# Patient Record
Sex: Female | Born: 1980 | Race: Black or African American | Hispanic: No | Marital: Single | Smoking: Never smoker
Health system: Southern US, Community
[De-identification: ages and names within clinical notes are randomized; demographics above are authoritative.]

## PROBLEM LIST (undated history)

## (undated) DIAGNOSIS — F410 Panic disorder [episodic paroxysmal anxiety] without agoraphobia: Secondary | ICD-10-CM

## (undated) DIAGNOSIS — R51 Headache: Secondary | ICD-10-CM

## (undated) DIAGNOSIS — F419 Anxiety disorder, unspecified: Secondary | ICD-10-CM

## (undated) DIAGNOSIS — R519 Headache, unspecified: Secondary | ICD-10-CM

## (undated) DIAGNOSIS — J45909 Unspecified asthma, uncomplicated: Secondary | ICD-10-CM

## (undated) DIAGNOSIS — F39 Unspecified mood [affective] disorder: Secondary | ICD-10-CM

## (undated) DIAGNOSIS — F909 Attention-deficit hyperactivity disorder, unspecified type: Secondary | ICD-10-CM

## (undated) DIAGNOSIS — F32A Depression, unspecified: Secondary | ICD-10-CM

## (undated) DIAGNOSIS — F319 Bipolar disorder, unspecified: Secondary | ICD-10-CM

## (undated) DIAGNOSIS — T7840XA Allergy, unspecified, initial encounter: Secondary | ICD-10-CM

## (undated) DIAGNOSIS — F329 Major depressive disorder, single episode, unspecified: Secondary | ICD-10-CM

## (undated) HISTORY — DX: Major depressive disorder, single episode, unspecified: F32.9

## (undated) HISTORY — DX: Unspecified asthma, uncomplicated: J45.909

## (undated) HISTORY — DX: Unspecified mood (affective) disorder: F39

## (undated) HISTORY — DX: Anxiety disorder, unspecified: F41.9

## (undated) HISTORY — DX: Allergy, unspecified, initial encounter: T78.40XA

## (undated) HISTORY — DX: Headache, unspecified: R51.9

## (undated) HISTORY — DX: Panic disorder (episodic paroxysmal anxiety): F41.0

## (undated) HISTORY — DX: Depression, unspecified: F32.A

## (undated) HISTORY — DX: Headache: R51

## (undated) HISTORY — DX: Bipolar disorder, unspecified: F31.9

## (undated) HISTORY — DX: Attention-deficit hyperactivity disorder, unspecified type: F90.9

---
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
## 1998-03-21 ENCOUNTER — Inpatient Hospital Stay (HOSPITAL_COMMUNITY): Admission: AD | Admit: 1998-03-21 | Discharge: 1998-03-21 | Payer: Self-pay | Admitting: Obstetrics

## 1998-03-31 ENCOUNTER — Ambulatory Visit (HOSPITAL_COMMUNITY): Admission: RE | Admit: 1998-03-31 | Discharge: 1998-03-31 | Payer: Self-pay | Admitting: Obstetrics

## 1998-06-24 ENCOUNTER — Inpatient Hospital Stay (HOSPITAL_COMMUNITY): Admission: AD | Admit: 1998-06-24 | Discharge: 1998-06-26 | Payer: Self-pay | Admitting: Obstetrics

## 1998-06-30 ENCOUNTER — Emergency Department (HOSPITAL_COMMUNITY): Admission: EM | Admit: 1998-06-30 | Discharge: 1998-06-30 | Payer: Self-pay | Admitting: Emergency Medicine

## 1999-05-17 ENCOUNTER — Emergency Department (HOSPITAL_COMMUNITY): Admission: EM | Admit: 1999-05-17 | Discharge: 1999-05-17 | Payer: Self-pay | Admitting: Emergency Medicine

## 1999-11-08 ENCOUNTER — Other Ambulatory Visit: Admission: RE | Admit: 1999-11-08 | Discharge: 1999-11-08 | Payer: Self-pay | Admitting: Obstetrics

## 2000-08-27 ENCOUNTER — Other Ambulatory Visit: Admission: RE | Admit: 2000-08-27 | Discharge: 2000-08-27 | Payer: Self-pay | Admitting: Obstetrics

## 2001-02-24 ENCOUNTER — Emergency Department (HOSPITAL_COMMUNITY): Admission: EM | Admit: 2001-02-24 | Discharge: 2001-02-24 | Payer: Self-pay | Admitting: Emergency Medicine

## 2001-03-05 ENCOUNTER — Ambulatory Visit (HOSPITAL_COMMUNITY): Admission: RE | Admit: 2001-03-05 | Discharge: 2001-03-05 | Payer: Self-pay | Admitting: Family Medicine

## 2001-03-05 ENCOUNTER — Encounter: Payer: Self-pay | Admitting: Family Medicine

## 2001-12-25 ENCOUNTER — Encounter: Admission: RE | Admit: 2001-12-25 | Discharge: 2001-12-25 | Payer: Self-pay | Admitting: Family Medicine

## 2001-12-25 ENCOUNTER — Encounter: Payer: Self-pay | Admitting: Family Medicine

## 2003-01-19 ENCOUNTER — Encounter: Payer: Self-pay | Admitting: Family Medicine

## 2003-01-19 ENCOUNTER — Ambulatory Visit (HOSPITAL_COMMUNITY): Admission: RE | Admit: 2003-01-19 | Discharge: 2003-01-19 | Payer: Self-pay | Admitting: Family Medicine

## 2003-01-30 ENCOUNTER — Encounter: Payer: Self-pay | Admitting: Family Medicine

## 2003-01-30 ENCOUNTER — Ambulatory Visit (HOSPITAL_COMMUNITY): Admission: RE | Admit: 2003-01-30 | Discharge: 2003-01-30 | Payer: Self-pay | Admitting: Family Medicine

## 2003-04-07 ENCOUNTER — Emergency Department (HOSPITAL_COMMUNITY): Admission: AD | Admit: 2003-04-07 | Discharge: 2003-04-07 | Payer: Self-pay | Admitting: Emergency Medicine

## 2003-04-07 ENCOUNTER — Encounter: Payer: Self-pay | Admitting: Emergency Medicine

## 2003-04-08 ENCOUNTER — Ambulatory Visit (HOSPITAL_COMMUNITY): Admission: RE | Admit: 2003-04-08 | Discharge: 2003-04-08 | Payer: Self-pay | Admitting: Emergency Medicine

## 2003-04-08 ENCOUNTER — Encounter: Payer: Self-pay | Admitting: Emergency Medicine

## 2003-04-13 ENCOUNTER — Emergency Department (HOSPITAL_COMMUNITY): Admission: EM | Admit: 2003-04-13 | Discharge: 2003-04-13 | Payer: Self-pay | Admitting: *Deleted

## 2004-04-15 ENCOUNTER — Emergency Department (HOSPITAL_COMMUNITY): Admission: EM | Admit: 2004-04-15 | Discharge: 2004-04-15 | Payer: Self-pay

## 2005-03-15 ENCOUNTER — Ambulatory Visit (HOSPITAL_COMMUNITY): Admission: RE | Admit: 2005-03-15 | Discharge: 2005-03-15 | Payer: Self-pay | Admitting: Nephrology

## 2005-03-20 ENCOUNTER — Ambulatory Visit (HOSPITAL_COMMUNITY): Admission: RE | Admit: 2005-03-20 | Discharge: 2005-03-20 | Payer: Self-pay | Admitting: Nephrology

## 2005-05-08 ENCOUNTER — Ambulatory Visit (HOSPITAL_COMMUNITY): Admission: RE | Admit: 2005-05-08 | Discharge: 2005-05-08 | Payer: Self-pay | Admitting: Nephrology

## 2005-06-07 ENCOUNTER — Emergency Department (HOSPITAL_COMMUNITY): Admission: EM | Admit: 2005-06-07 | Discharge: 2005-06-07 | Payer: Self-pay | Admitting: Emergency Medicine

## 2005-08-23 ENCOUNTER — Encounter: Admission: RE | Admit: 2005-08-23 | Discharge: 2005-08-23 | Payer: Self-pay | Admitting: Obstetrics

## 2006-07-10 ENCOUNTER — Ambulatory Visit (HOSPITAL_COMMUNITY): Admission: RE | Admit: 2006-07-10 | Discharge: 2006-07-10 | Payer: Self-pay | Admitting: Nephrology

## 2007-01-27 ENCOUNTER — Emergency Department (HOSPITAL_COMMUNITY): Admission: EM | Admit: 2007-01-27 | Discharge: 2007-01-27 | Payer: Self-pay | Admitting: Family Medicine

## 2007-04-17 ENCOUNTER — Encounter: Admission: RE | Admit: 2007-04-17 | Discharge: 2007-04-17 | Payer: Self-pay | Admitting: Nephrology

## 2007-04-26 ENCOUNTER — Encounter: Admission: RE | Admit: 2007-04-26 | Discharge: 2007-04-26 | Payer: Self-pay | Admitting: Gastroenterology

## 2007-06-11 ENCOUNTER — Emergency Department (HOSPITAL_COMMUNITY): Admission: EM | Admit: 2007-06-11 | Discharge: 2007-06-11 | Payer: Self-pay | Admitting: Emergency Medicine

## 2007-09-02 ENCOUNTER — Encounter: Admission: RE | Admit: 2007-09-02 | Discharge: 2007-10-09 | Payer: Self-pay | Admitting: Urology

## 2007-10-06 ENCOUNTER — Emergency Department (HOSPITAL_COMMUNITY): Admission: EM | Admit: 2007-10-06 | Discharge: 2007-10-06 | Payer: Self-pay | Admitting: Emergency Medicine

## 2007-10-06 IMAGING — CR DG STERNUM 2+V
2 series · 2 of 2 positions shown · non-contrast
Comparison: none

CLINICAL DATA: 25-year-old who fell.
 STERNUM ? 2 VIEW:

[w sternum lat *]
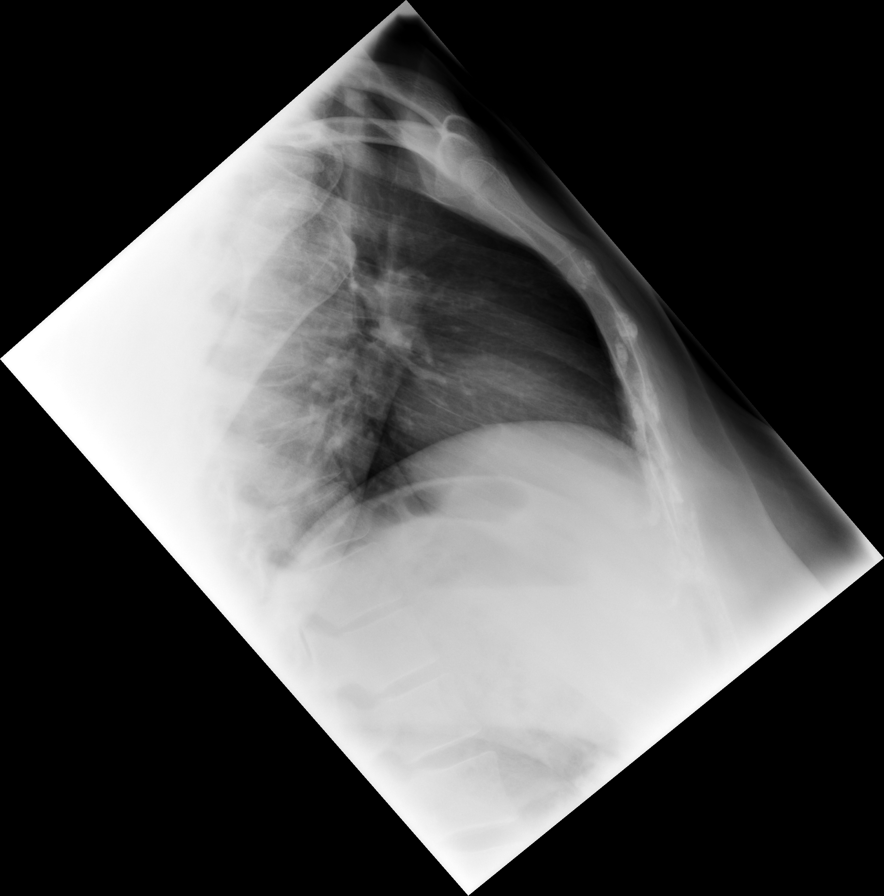

[w sternum lat]
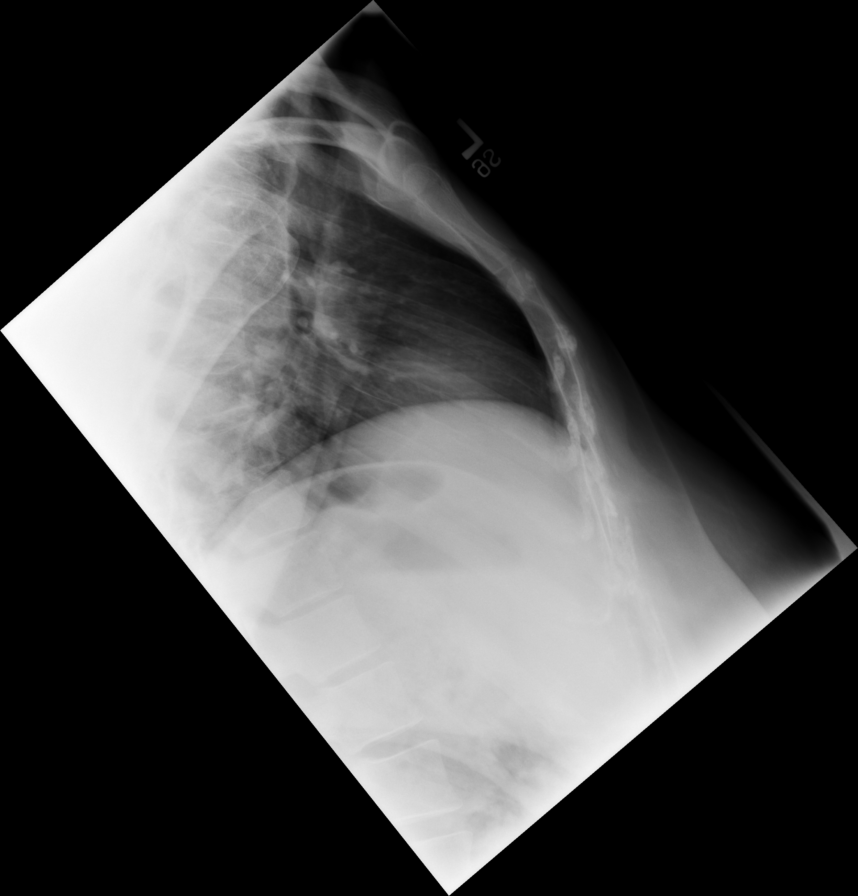

[2 of 2 positions shown; findings below may reference images not displayed]

FINDINGS: Two lateral views of the sternum demonstrate no definite sternal fractures. No substernal hematoma.
IMPRESSION: No plain film evidence for sternal fracture.

## 2007-10-15 ENCOUNTER — Encounter: Admission: RE | Admit: 2007-10-15 | Discharge: 2007-10-15 | Payer: Self-pay | Admitting: Nephrology

## 2007-10-23 ENCOUNTER — Encounter: Admission: RE | Admit: 2007-10-23 | Discharge: 2007-10-23 | Payer: Self-pay | Admitting: Psychiatry

## 2008-02-17 ENCOUNTER — Encounter: Admission: RE | Admit: 2008-02-17 | Discharge: 2008-02-17 | Payer: Self-pay | Admitting: Nephrology

## 2008-12-28 ENCOUNTER — Emergency Department (HOSPITAL_COMMUNITY): Admission: EM | Admit: 2008-12-28 | Discharge: 2008-12-28 | Payer: Self-pay | Admitting: Emergency Medicine

## 2010-01-11 ENCOUNTER — Encounter: Admission: RE | Admit: 2010-01-11 | Discharge: 2010-01-11 | Payer: Self-pay | Admitting: Nephrology

## 2010-10-30 ENCOUNTER — Encounter: Payer: Self-pay | Admitting: Nephrology

## 2011-08-18 ENCOUNTER — Other Ambulatory Visit (HOSPITAL_COMMUNITY): Payer: Self-pay | Admitting: Nephrology

## 2011-08-18 ENCOUNTER — Ambulatory Visit (HOSPITAL_COMMUNITY)
Admission: RE | Admit: 2011-08-18 | Discharge: 2011-08-18 | Disposition: A | Discharge status: Home / Self Care | Payer: Medicaid Other | Source: Ambulatory Visit | Attending: Nephrology | Admitting: Nephrology

## 2011-08-18 DIAGNOSIS — R52 Pain, unspecified: Secondary | ICD-10-CM

## 2011-08-18 DIAGNOSIS — R0789 Other chest pain: Secondary | ICD-10-CM | POA: Insufficient documentation

## 2011-08-18 DIAGNOSIS — R0989 Other specified symptoms and signs involving the circulatory and respiratory systems: Secondary | ICD-10-CM | POA: Insufficient documentation

## 2011-08-18 DIAGNOSIS — R0609 Other forms of dyspnea: Secondary | ICD-10-CM | POA: Insufficient documentation

## 2012-01-09 ENCOUNTER — Encounter (HOSPITAL_COMMUNITY): Payer: Self-pay | Admitting: Emergency Medicine

## 2012-01-09 ENCOUNTER — Emergency Department (HOSPITAL_COMMUNITY)
Admission: EM | Admit: 2012-01-09 | Discharge: 2012-01-09 | Disposition: A | Discharge status: Home / Self Care | Payer: Medicaid Other | Attending: Emergency Medicine | Admitting: Emergency Medicine

## 2012-01-09 DIAGNOSIS — K089 Disorder of teeth and supporting structures, unspecified: Secondary | ICD-10-CM | POA: Insufficient documentation

## 2012-01-09 DIAGNOSIS — H9209 Otalgia, unspecified ear: Secondary | ICD-10-CM | POA: Insufficient documentation

## 2012-01-09 DIAGNOSIS — Z79899 Other long term (current) drug therapy: Secondary | ICD-10-CM | POA: Insufficient documentation

## 2012-01-09 DIAGNOSIS — R6884 Jaw pain: Secondary | ICD-10-CM | POA: Insufficient documentation

## 2012-01-09 MED ORDER — ANTIPYRINE-BENZOCAINE 5.4-1.4 % OT SOLN: 3.0000 [drp] | Freq: Four times a day (QID) | OTIC | Status: DC | PRN

## 2012-01-09 MED ORDER — TRAMADOL HCL 50 MG PO TABS: 50.0000 mg | ORAL_TABLET | Freq: Four times a day (QID) | ORAL | Status: AC | PRN

## 2012-01-09 MED ORDER — TRAMADOL HCL 50 MG PO TABS: 50.0000 mg | ORAL_TABLET | Freq: Four times a day (QID) | ORAL | Status: DC | PRN

## 2012-01-09 MED ORDER — ANTIPYRINE-BENZOCAINE 5.4-1.4 % OT SOLN: 3.0000 [drp] | Freq: Four times a day (QID) | OTIC | Status: AC | PRN

## 2012-01-09 NOTE — ED Provider Notes (Signed)
History     CSN: [id number]  Arrival date & time 01/09/12  1914 HPI This reports for one week has had pain in her right jaw. States pain seems to radiate from her right ear down her right jaw. States pain also radiates into her teeth but does not particularly have dental pain. Denies facial swelling, fever, sore throat, difficulty breathing, throat irritation, headache, sinus congestion. Denies ear drainage, hearing loss, or neck pain.  Patient is a 31 y.o. female presenting with tooth pain. The history is provided by the patient.  Dental PainPrimary symptoms do not include dental injury, oral bleeding, oral lesions, headaches, fever, shortness of breath, sore throat, angioedema or cough. Primary symptoms comment: Jaw pain and ear pain The symptoms began 5 to 7 days ago. The symptoms are worsening. The symptoms are new. The symptoms occur constantly.  Additional symptoms include: jaw pain and ear pain. Additional symptoms do not include: dental sensitivity to temperature, gum swelling, gum tenderness, purulent gums, trismus, facial swelling, trouble swallowing, pain with swallowing, hearing loss and swollen glands.    History reviewed. No pertinent past medical history.  History reviewed. No pertinent past surgical history.  No family history on file.  History  Substance Use Topics  . Smoking status: Not on file  . Smokeless tobacco: Not on file  . Alcohol Use: Not on file    OB History    Grav Para Term Preterm Abortions TAB SAB Ect Mult Living                  Review of Systems  Constitutional: Negative for fever.  HENT: Positive for ear pain and dental problem. Negative for hearing loss, sore throat, facial swelling and trouble swallowing.   Respiratory: Negative for cough, shortness of breath and wheezing.   Gastrointestinal: Negative for nausea, vomiting and abdominal pain.  Neurological: Negative for headaches.  All other systems reviewed and are negative.    Allergies    Lidocaine  Home Medications   Current Outpatient Rx  Name Route Sig Dispense Refill  . COLLAGEN PO Oral Take 1 capsule by mouth daily.    . IBUPROFEN 200 MG PO TABS Oral Take 400 mg by mouth every 6 (six) hours as needed. For pain    . ADULT MULTIVITAMIN W/MINERALS CH Oral Take 1 tablet by mouth daily.    . ALEVE PO Oral Take 1 tablet by mouth daily as needed. For pain    . OVER THE COUNTER MEDICATION Oral Take 1 capsule by mouth daily. Omega red      BP 156/97  Pulse 82  Temp(Src) 98.2 F (36.8 C) (Oral)  Resp 14  SpO2 99%  LMP 12/23/2011  Physical Exam  Vitals reviewed. Constitutional: She is oriented to person, place, and time. Vital signs are normal. She appears well-developed and well-nourished.  HENT:  Head: Normocephalic and atraumatic.  Right Ear: Hearing, tympanic membrane, external ear and ear canal normal.  Left Ear: Hearing, tympanic membrane, external ear and ear canal normal.  Nose: Nose normal. Right sinus exhibits no maxillary sinus tenderness and no frontal sinus tenderness. Left sinus exhibits no maxillary sinus tenderness and no frontal sinus tenderness.  Mouth/Throat: Uvula is midline and oropharynx is clear and moist.  Eyes: Conjunctivae are normal. Pupils are equal, round, and reactive to light.  Neck: Normal range of motion. Neck supple.  Cardiovascular: Normal rate, regular rhythm and normal heart sounds.  Exam reveals no friction rub.   No murmur heard. Pulmonary/Chest: Effort normal  and breath sounds normal. She has no wheezes. She has no rhonchi. She has no rales. She exhibits no tenderness.  Musculoskeletal: Normal range of motion.  Neurological: She is alert and oriented to person, place, and time. Coordination normal.  Skin: Skin is warm and dry. No rash noted. No erythema. No pallor.    ED Course  Procedures  MDM   Dentition appears normal. No otitis externa or media. Discharge home with Auralgan and correct dosages of ibuprofen and  Tylenol. Will also give referral for ENT and dentist. Patient agrees with plan and is ready for discharge     Thomasene Lot, PA-C 01/09/12 1610

## 2012-01-09 NOTE — Discharge Instructions (Signed)
Please used eardrops for pain. Tramadol is also for pain. He can take up to 800 mg of ibuprofen with food every 8 hours as needed for pain. You can also take acetaminophen 1000 mg every 8 hours as needed for pain. If you see no improvement in symptoms please followup with ENT or dentist.  Eardrops You have been diagnosed with a condition requiring you to put drops of medication into your outer ear. HOME CARE INSTRUCTIONS   Put drops in the affected ear as instructed. After putting the drops in, you will need to lay down with the affected ear facing up for ten minutes so the drops will remain in the ear canal and run down and fill the canal. Continue using eardrops for as long as directed by your caregiver.   Prior to getting up, put a cotton ball gently in your ear canal. Leave this plug out far enough so it can be easily removed. Do not attempt to push this down into the canal with a Q-tip or other instrument.   Do not irrigate or wash out your ears if you have had a perforated eardrum or mastoid surgery, or unless instructed to do so by your caregiver.   Keep appointments with your caregiver as instructed.   Finish all medications, or use for the length of time as instructed. Continue the drops even if your problem seems to be doing well after a couple days, or continue as instructed.  Return to see your caregiver if you are unsuccessful with the above instructions for home care.  SEEK MEDICAL CARE IF:  You become worse or develop increasing pain.   You notice any unusual drainage from your ear.   You develop hearing difficulties.   You have any other questions or concerns.  MAKE SURE YOU:   Understand these instructions.   Will watch your condition.   Will get help right away if you are not doing well or get worse.  Document Released: 09/19/2001 Document Revised: 09/14/2011 Document Reviewed: 04/13/2009 [facility] Patient Information 2012 [place], [place].  Otalgia The most common  reason for this in children is an infection of the middle ear. Pain from the middle ear is usually caused by a build-up of fluid and pressure behind the eardrum. Pain from an earache can be sharp, dull, or burning. The pain may be temporary or constant. The middle ear is connected to the nasal passages by a short narrow tube called the Eustachian tube. The Eustachian tube allows fluid to drain out of the middle ear, and helps keep the pressure in your ear equalized. CAUSES  A cold or allergy can block the Eustachian tube with inflammation and the build-up of secretions. This is especially likely in small children, because their Eustachian tube is shorter and more horizontal. When the Eustachian tube closes, the normal flow of fluid from the middle ear is stopped. Fluid can accumulate and cause stuffiness, pain, hearing loss, and an ear infection if germs start growing in this area. SYMPTOMS  The symptoms of an ear infection may include fever, ear pain, fussiness, increased crying, and irritability. Many children will have temporary and minor hearing loss during and right after an ear infection. Permanent hearing loss is rare, but the risk increases the more infections a child has. Other causes of ear pain include retained water in the outer ear canal from swimming and bathing. Ear pain in adults is less likely to be from an ear infection. Ear pain may be referred from other  locations. Referred pain may be from the joint between your jaw and the skull. It may also come from a tooth problem or problems in the neck. Other causes of ear pain include:  A foreign body in the ear.   Outer ear infection.   Sinus infections.   Impacted ear wax.   Ear injury.   Arthritis of the jaw or TMJ problems.   Middle ear infection.   Tooth infections.   Sore throat with pain to the ears.  DIAGNOSIS  Your caregiver can usually make the diagnosis by examining you. Sometimes other special studies, including x-rays  and lab work may be necessary. TREATMENT   If antibiotics were prescribed, use them as directed and finish them even if you or your child's symptoms seem to be improved.   Sometimes PE tubes are needed in children. These are little plastic tubes which are put into the eardrum during a simple surgical procedure. They allow fluid to drain easier and allow the pressure in the middle ear to equalize. This helps relieve the ear pain caused by pressure changes.  HOME CARE INSTRUCTIONS   Only take over-the-counter or prescription medicines for pain, discomfort, or fever as directed by your caregiver. DO NOT GIVE CHILDREN ASPIRIN because of the association of Reye's Syndrome in children taking aspirin.   Use a cold pack applied to the outer ear for 15 to 20 minutes, 3 to 4 times per day or as needed may reduce pain. Do not apply ice directly to the skin. You may cause frost bite.   Over-the-counter ear drops used as directed may be effective. Your caregiver may sometimes prescribe ear drops.   Resting in an upright position may help reduce pressure in the middle ear and relieve pain.   Ear pain caused by rapidly descending from high altitudes can be relieved by swallowing or chewing gum. Allowing infants to suck on a bottle during airplane travel can help.   Do not smoke in the house or near children. If you are unable to quit smoking, smoke outside.   Control allergies.  SEEK IMMEDIATE MEDICAL CARE IF:   You or your child are becoming sicker.   Pain or fever relief is not obtained with medicine.   You or your child's symptoms (pain, fever, or irritability) do not improve within 24 to 48 hours or as instructed.   Severe pain suddenly stops hurting. This may indicate a ruptured eardrum.   You or your children develop new problems such as severe headaches, stiff neck, difficulty swallowing, or swelling of the face or around the ear.  Document Released: 05/12/2004 Document Revised: 09/14/2011  Document Reviewed: 09/16/2008 [facility] Patient Information 2012 [place], [place].

## 2012-01-09 NOTE — ED Notes (Signed)
PT. REPORTS RIGHT LOWER MOLAR PAIN RADIATING TO RIGHT EAR AND JAW FOR SEVERAL DAYS .

## 2012-01-09 NOTE — ED Provider Notes (Signed)
Medical screening examination/treatment/procedure(s) were performed by non-physician practitioner and as supervising physician I was immediately available for consultation/collaboration.  Toy Baker, MD 01/09/12 [phone]

## 2012-06-10 ENCOUNTER — Emergency Department (HOSPITAL_COMMUNITY)
Admission: EM | Admit: 2012-06-10 | Discharge: 2012-06-10 | Disposition: A | Discharge status: Home / Self Care | Payer: Medicaid Other | Attending: Emergency Medicine | Admitting: Emergency Medicine

## 2012-06-10 ENCOUNTER — Encounter (HOSPITAL_COMMUNITY): Payer: Self-pay | Admitting: Nurse Practitioner

## 2012-06-10 DIAGNOSIS — L0390 Cellulitis, unspecified: Secondary | ICD-10-CM

## 2012-06-10 DIAGNOSIS — IMO0002 Reserved for concepts with insufficient information to code with codable children: Secondary | ICD-10-CM | POA: Insufficient documentation

## 2012-06-10 MED ORDER — CEPHALEXIN 500 MG PO CAPS: 500.0000 mg | ORAL_CAPSULE | Freq: Four times a day (QID) | ORAL | Status: DC

## 2012-06-10 MED ORDER — OXYCODONE-ACETAMINOPHEN 5-325 MG PO TABS: ORAL_TABLET | ORAL | Status: DC

## 2012-06-10 MED ORDER — SULFAMETHOXAZOLE-TRIMETHOPRIM 800-160 MG PO TABS: 1.0000 | ORAL_TABLET | Freq: Two times a day (BID) | ORAL | Status: DC

## 2012-06-10 MED ORDER — OXYCODONE-ACETAMINOPHEN 5-325 MG PO TABS
1.0000 | ORAL_TABLET | Freq: Once | ORAL | Status: AC
  Administered 2012-06-10: 1 via ORAL
  Filled 2012-06-10: qty 1

## 2012-06-10 MED ORDER — BUPIVACAINE HCL 0.5 % IJ SOLN
15.0000 mL | Freq: Once | INTRAMUSCULAR | Status: AC
  Administered 2012-06-10: 15 mL
  Filled 2012-06-10: qty 15

## 2012-06-10 NOTE — ED Provider Notes (Signed)
History     CSN: [id number]  Arrival date & time 06/10/12  1016   First MD Initiated Contact with Patient 06/10/12 1039      Chief Complaint  Patient presents with  . Abscess    (Consider location/radiation/quality/duration/timing/severity/associated sxs/prior treatment) HPI  31 y.o. female in no acute distress complaining of bilateral axilla abscess is worsening over the course of 2 days. Patient states right abscess opened last night. Denies fever, nausea vomiting, prior episodes. Pain is two out of 10, exacerbated just abated with movement and pressure.   History reviewed. No pertinent past medical history.  History reviewed. No pertinent past surgical history.  History reviewed. No pertinent family history.  History  Substance Use Topics  . Smoking status: Never Smoker   . Smokeless tobacco: Not on file  . Alcohol Use: Yes     occasional    OB History    Grav Para Term Preterm Abortions TAB SAB Ect Mult Living                  Review of Systems  Constitutional: Negative for fever.  Respiratory: Negative for shortness of breath.   Cardiovascular: Negative for chest pain.  Gastrointestinal: Negative for nausea, vomiting, abdominal pain and diarrhea.  All other systems reviewed and are negative.    Allergies  Lidocaine  Home Medications   Current Outpatient Rx  Name Route Sig Dispense Refill  . ALPRAZOLAM 1 MG PO TABS Oral Take 1 mg by mouth 3 (three) times daily as needed. anxiety    . AMPHETAMINE-DEXTROAMPHETAMINE 15 MG PO TABS Oral Take 15 mg by mouth daily as needed. Only takes on school days.    . CYCLOBENZAPRINE HCL 10 MG PO TABS Oral Take 10 mg by mouth 3 (three) times daily as needed. Muscle spasm.    . ADULT MULTIVITAMIN W/MINERALS CH Oral Take 1 tablet by mouth daily.    . TRAMADOL HCL 50 MG PO TABS Oral Take 50 mg by mouth every 6 (six) hours as needed. pain    . CEPHALEXIN 500 MG PO CAPS Oral Take 1 capsule (500 mg total) by mouth 4 (four)  times daily. 28 capsule 0  . IBUPROFEN 200 MG PO TABS Oral Take 400 mg by mouth every 6 (six) hours as needed. For pain    . OXYCODONE-ACETAMINOPHEN 5-325 MG PO TABS  1 to 2 tabs PO q6hrs  PRN for pain 8 tablet 0  . SULFAMETHOXAZOLE-TRIMETHOPRIM 800-160 MG PO TABS Oral Take 1 tablet by mouth every 12 (twelve) hours. 14 tablet 0    BP 122/75  Pulse 91  Temp 98.6 F (37 C) (Oral)  Resp 18  SpO2 95%  LMP 05/16/2012  Physical Exam  Nursing note and vitals reviewed. Constitutional: She is oriented to person, place, and time. She appears well-developed and well-nourished. No distress.  HENT:  Head: Normocephalic.  Eyes: Conjunctivae and EOM are normal.  Cardiovascular: Normal rate.   Pulmonary/Chest: Effort normal.  Musculoskeletal: Normal range of motion.  Neurological: She is alert and oriented to person, place, and time.  Skin:       Single abscess to bilateral axilla as they measure approximately 3.5 cm in diameter and are both fluctuant in the center with surrounding induration.  Psychiatric: She has a normal mood and affect.    ED Course  Procedures (including critical care time)  INCISION AND DRAINAGE Performed by: Wynetta Emery Consent: Verbal consent obtained. Risks and benefits: risks, benefits and alternatives were discussed Type: abscess  Body area: Bilateral axilla  Anesthesia: local infiltration  Local anesthetic: Marcaine   Anesthetic total: 15 ml  Complexity: complex Blunt dissection to break up loculations  Drainage: purulent  Drainage amount: 5-7 mL from each abscess   Packing material: 1/4 in iodoform gauze  Patient tolerance: Patient tolerated the procedure well with no immediate complications.    Labs Reviewed - No data to display No results found.   1. Abscess and cellulitis       MDM  Patient with bilateral axilla abscesses wound cultures sent both abscesses incised and drained and packed. Patient will return in 48 hours for  recheck and packing removal. Return precautions given.  New Prescriptions   CEPHALEXIN (KEFLEX) 500 MG CAPSULE    Take 1 capsule (500 mg total) by mouth 4 (four) times daily.   OXYCODONE-ACETAMINOPHEN (PERCOCET/ROXICET) 5-325 MG PER TABLET    1 to 2 tabs PO q6hrs  PRN for pain   SULFAMETHOXAZOLE-TRIMETHOPRIM (SEPTRA DS) 800-160 MG PER TABLET    Take 1 tablet by mouth every 12 (twelve) hours.          Wynetta Emery, PA-C 06/10/12 1202

## 2012-06-10 NOTE — ED Provider Notes (Signed)
Medical screening examination/treatment/procedure(s) were performed by non-physician practitioner and as supervising physician I was immediately available for consultation/collaboration.   Rolan Bucco, MD 06/10/12 1447

## 2012-06-10 NOTE — ED Notes (Signed)
Pt. C/o bilateral abscess in under arms. Pt. States Right abscess had some bloody pus drainage last night. Pt. Has minimal pain 2/3 at present time.

## 2012-06-10 NOTE — ED Notes (Signed)
Supplies at bedside. PA made aware

## 2012-06-12 ENCOUNTER — Encounter (HOSPITAL_COMMUNITY): Payer: Self-pay | Admitting: *Deleted

## 2012-06-12 ENCOUNTER — Emergency Department (HOSPITAL_COMMUNITY)
Admission: EM | Admit: 2012-06-12 | Discharge: 2012-06-12 | Disposition: A | Discharge status: Home / Self Care | Payer: Medicaid Other | Attending: Emergency Medicine | Admitting: Emergency Medicine

## 2012-06-12 DIAGNOSIS — Z888 Allergy status to other drugs, medicaments and biological substances status: Secondary | ICD-10-CM | POA: Insufficient documentation

## 2012-06-12 DIAGNOSIS — L0291 Cutaneous abscess, unspecified: Secondary | ICD-10-CM

## 2012-06-12 DIAGNOSIS — Z09 Encounter for follow-up examination after completed treatment for conditions other than malignant neoplasm: Secondary | ICD-10-CM | POA: Insufficient documentation

## 2012-06-12 LAB — WOUND CULTURE

## 2012-06-12 MED ORDER — ONDANSETRON 8 MG PO TBDP: 8.0000 mg | ORAL_TABLET | Freq: Three times a day (TID) | ORAL | Status: AC | PRN

## 2012-06-12 NOTE — ED Provider Notes (Signed)
History   This chart was scribed for Celene Kras, MD by Charolett Bumpers . The patient was seen in room TR07C/TR07C. Patient's care was started at 1215.    CSN: [id number]  Arrival date & time 06/12/12  1215   First MD Initiated Contact with Patient 06/12/12 1328      Chief Complaint  Patient presents with  . Follow-up  . Wound Check    (Consider location/radiation/quality/duration/timing/severity/associated sxs/prior treatment) HPI Nicole Rangel is a 31 y.o. female who presents to the Emergency Department a wound recheck. Pt reports that she had an I & D preformed 2 days ago for bilaterally axillary abscesses. Pt reports some associated soreness but denies any complications. Pt also reports some nausea due to the abx. Pt denies any fevers. Pt reports that she has been taking antibiotics as prescribed. Pt denies any h/o abscess previously.   History reviewed. No pertinent past medical history.  History reviewed. No pertinent past surgical history.  History reviewed. No pertinent family history.  History  Substance Use Topics  . Smoking status: Never Smoker   . Smokeless tobacco: Not on file  . Alcohol Use: Yes     occasional    OB History    Grav Para Term Preterm Abortions TAB SAB Ect Mult Living                  Review of Systems  Constitutional: Negative for fever and chills.  Respiratory: Negative for shortness of breath.   Gastrointestinal: Positive for nausea. Negative for vomiting.  Skin: Positive for wound.  Neurological: Negative for weakness.  All other systems reviewed and are negative.    Allergies  Lidocaine  Home Medications   Current Outpatient Rx  Name Route Sig Dispense Refill  . ALPRAZOLAM 1 MG PO TABS Oral Take 1 mg by mouth 3 (three) times daily as needed. anxiety    . AMPHETAMINE-DEXTROAMPHETAMINE 15 MG PO TABS Oral Take 15 mg by mouth daily as needed. Only takes on school days.    . CEPHALEXIN 500 MG PO CAPS Oral Take 500 mg by  mouth 4 (four) times daily. For 7 days Started 9/2    . IBUPROFEN 200 MG PO TABS Oral Take 400 mg by mouth every 6 (six) hours as needed. For pain    . ADULT MULTIVITAMIN W/MINERALS CH Oral Take 1 tablet by mouth daily.    . OMEGA 3 PO Oral Take 1 capsule by mouth daily.    . OXYCODONE-ACETAMINOPHEN 5-325 MG PO TABS Oral Take 1-2 tablets by mouth every 6 (six) hours as needed. For pain    . SULFAMETHOXAZOLE-TMP DS 800-160 MG PO TABS Oral Take 1 tablet by mouth 2 (two) times daily. For 7 days Started 9/2    . TRAMADOL HCL 50 MG PO TABS Oral Take 50 mg by mouth every 6 (six) hours as needed. pain      BP 132/74  Pulse 78  Temp 98.7 F (37.1 C)  Resp 15  SpO2 99%  LMP 05/16/2012  Physical Exam  Nursing note and vitals reviewed. Constitutional: She appears well-developed and well-nourished. No distress.  HENT:  Head: Normocephalic and atraumatic.  Right Ear: External ear normal.  Left Ear: External ear normal.  Eyes: Conjunctivae are normal. Right eye exhibits no discharge. Left eye exhibits no discharge. No scleral icterus.  Neck: Neck supple. No tracheal deviation present.  Cardiovascular: Normal rate.   Pulmonary/Chest: Effort normal. No stridor. No respiratory distress.  Musculoskeletal: She exhibits  no edema.  Neurological: She is alert. Cranial nerve deficit: no gross deficits.  Skin: Skin is warm and dry. No rash noted.       Status post I&D of bilaterally axillary abscesses. Small less than 1 cm incision with packing noted. On right side, no erythema, mild induration with no drainage noted. On left, small amount of purulent drainage noted with removal of packing.  Psychiatric: She has a normal mood and affect.    ED Course  Procedures (including critical care time)  DIAGNOSTIC STUDIES: Oxygen Saturation is 99% on room air, normal by my interpretation.    COORDINATION OF CARE:  13:38-Discussed planned course of treatment with the patient including inserting additional  packing into left axillary abscess, who is agreeable at this time. Pt is requesting anti-nausea medication due to the nausea from her abx.   13:45-Left axilla repacked with a few centimeters of iodoform gauze. Patient tolerated the procedure well with no immediate complications      Labs Reviewed - No data to display No results found.   1. Abscess       MDM  Right abscess mostly resolved.  Still with some drainage on left.  Loosely packed.  Return for recheck   I personally performed the services described in this documentation, which was scribed in my presence.  The recorded information has been reviewed and considered.       Celene Kras, MD 06/12/12 2017

## 2012-06-12 NOTE — ED Notes (Signed)
Pt was here on the 2nd for bilateral abscesses, packing was placed. No fevers. Reports relief to axillaries. Pt has been taking antibiotics as rx'd.

## 2012-06-13 NOTE — ED Notes (Signed)
+   wound culture Patient treated with antibiotics and I/D done

## 2013-03-11 ENCOUNTER — Ambulatory Visit: Payer: Medicaid Other | Attending: Family Medicine | Admitting: Family Medicine

## 2013-03-11 VITALS — BP 133/87 | HR 100 | Temp 98.5°F | Resp 18 | Ht 66.0 in | Wt 239.0 lb

## 2013-03-11 DIAGNOSIS — E785 Hyperlipidemia, unspecified: Secondary | ICD-10-CM

## 2013-03-11 DIAGNOSIS — F319 Bipolar disorder, unspecified: Secondary | ICD-10-CM

## 2013-03-11 DIAGNOSIS — R32 Unspecified urinary incontinence: Secondary | ICD-10-CM | POA: Insufficient documentation

## 2013-03-11 DIAGNOSIS — F909 Attention-deficit hyperactivity disorder, unspecified type: Secondary | ICD-10-CM

## 2013-03-11 DIAGNOSIS — F988 Other specified behavioral and emotional disorders with onset usually occurring in childhood and adolescence: Secondary | ICD-10-CM | POA: Insufficient documentation

## 2013-03-11 DIAGNOSIS — F411 Generalized anxiety disorder: Secondary | ICD-10-CM | POA: Insufficient documentation

## 2013-03-11 LAB — COMPREHENSIVE METABOLIC PANEL
CO2: 23 mEq/L (ref 19–32)
Chloride: 102 mEq/L (ref 96–112)
Potassium: 3.7 mEq/L (ref 3.5–5.3)
Sodium: 134 mEq/L — ABNORMAL LOW (ref 135–145)
Total Bilirubin: 0.4 mg/dL (ref 0.3–1.2)
Total Protein: 6.7 g/dL (ref 6.0–8.3)

## 2013-03-11 LAB — LIPID PANEL
LDL Cholesterol: 137 mg/dL — ABNORMAL HIGH (ref 0–99)
Triglycerides: 149 mg/dL (ref <150)
VLDL: 30 mg/dL (ref 0–40)

## 2013-03-11 LAB — CBC
HCT: 37.7 % (ref 36.0–46.0)
MCH: 26.4 pg (ref 26.0–34.0)
MCV: 79 fL (ref 78.0–100.0)
WBC: 7.7 10*3/uL (ref 4.0–10.5)

## 2013-03-11 MED ORDER — FLUOXETINE HCL 40 MG PO CAPS: 40.0000 mg | ORAL_CAPSULE | Freq: Every day | ORAL | Status: DC

## 2013-03-11 NOTE — Progress Notes (Signed)
Patient ID: Nicole Rangel, female   DOB: 08-27-1981, 32 y.o.   MRN: [id number]  CC: establish    HPI: Pt is presenting today to get establish for primary care.  Pt is reporting that she has been treated for Adult ADHD at behavioral health and for bipolar disorder.  Pt says that she has not been taking her meds as prescribed.    Allergies  Allergen Reactions  . Lidocaine Swelling    To face.   Past Medical History  Diagnosis Date  . Allergy   . Asthma   . Anxiety   . Depression    Current Outpatient Prescriptions on File Prior to Visit  Medication Sig Dispense Refill  . ALPRAZolam (XANAX) 1 MG tablet Take 1 mg by mouth 3 (three) times daily as needed. anxiety      . amphetamine-dextroamphetamine (ADDERALL) 15 MG tablet Take 15 mg by mouth daily as needed. Only takes on school days.      Marland Kitchen ibuprofen (ADVIL,MOTRIN) 200 MG tablet Take 400 mg by mouth every 6 (six) hours as needed. For pain      . Multiple Vitamin (MULITIVITAMIN WITH MINERALS) TABS Take 1 tablet by mouth daily.      . Omega-3 Fatty Acids (OMEGA 3 PO) Take 1 capsule by mouth daily.       No current facility-administered medications on file prior to visit.   Family History  Problem Relation Age of Onset  . Diabetes Mother    History   Social History  . Marital Status: Single    Spouse Name: N/A    Number of Children: N/A  . Years of Education: N/A   Occupational History  . Not on file.   Social History Main Topics  . Smoking status: Never Smoker   . Smokeless tobacco: Not on file  . Alcohol Use: Yes     Comment: occasional  . Drug Use: No  . Sexually Active: Not on file   Other Topics Concern  . Not on file   Social History Narrative  . No narrative on file    Review of Systems  Constitutional: Negative for fever, chills, diaphoresis, activity change, appetite change and fatigue.  HENT: Negative for ear pain, nosebleeds, congestion, facial swelling, rhinorrhea, neck pain, neck stiffness and ear  discharge.   Eyes: Negative for pain, discharge, redness, itching and visual disturbance.  Respiratory: Negative for cough, choking, chest tightness, shortness of breath, wheezing and stridor.   Cardiovascular: Negative for chest pain, palpitations and leg swelling.  Gastrointestinal: Negative for abdominal distention.  Genitourinary: Negative for dysuria, urgency, frequency, hematuria, flank pain, decreased urine volume, difficulty urinating and dyspareunia.  Musculoskeletal: Negative for back pain, joint swelling, arthralgias and gait problem.  Neurological: Negative for dizziness, tremors, seizures, syncope, facial asymmetry, speech difficulty, weakness, light-headedness, numbness and headaches.  Hematological: Negative for adenopathy. Does not bruise/bleed easily.  Psychiatric/Behavioral: Negative for hallucinations, behavioral problems, confusion, dysphoric mood, decreased concentration and agitation.    Objective:   Filed Vitals:   03/11/13 1037  BP: 133/87  Pulse: 100  Temp: 98.5 F (36.9 C)  Resp: 18   Physical Exam  Constitutional: Appears well-developed and well-nourished. No distress.  HENT: Normocephalic. External right and left ear normal. Oropharynx is clear and moist.  Eyes: Conjunctivae and EOM are normal. PERRLA, no scleral icterus.  Neck: Normal ROM. Neck supple. No JVD. No tracheal deviation. No thyromegaly.  CVS: RRR, S1/S2 +, no murmurs, no gallops, no carotid bruit.  Pulmonary: Effort and  breath sounds normal, no stridor, rhonchi, wheezes, rales.  Abdominal: Soft. BS +,  no distension, tenderness, rebound or guarding.  Musculoskeletal: Normal range of motion. No edema and no tenderness.  Lymphadenopathy: No lymphadenopathy noted, cervical, inguinal. Neuro: Alert. Normal reflexes, muscle tone coordination. No cranial nerve deficit. Skin: Skin is warm and dry. No rash noted. Not diaphoretic. No erythema. No pallor.  Psychiatric: Normal mood and affect. Behavior,  judgment, thought content normal.   No results found for this basename: WBC, HGB, HCT, MCV, PLT   No results found for this basename: CREATININE, BUN, NA, K, CL, CO2    No results found for this basename: HGBA1C   Lipid Panel  No results found for this basename: chol, trig, hdl, cholhdl, vldl, ldlcalc     Assessment and plan:   Patient Active Problem List   Diagnosis Date Noted  . Adult ADHD 03/11/2013  . Generalized anxiety disorder 03/11/2013  . Dyslipidemia 03/11/2013       Urinary Incontinence      Bipolar Disorder   Pt says that she is not fond of physicians.  She would like to get her labs done today.  Pt needs referral to Dr. Brunilda Payor for follow up care for her urinary incontinence.  I gave her information to contact behavioral health directly and asked her to please call and make appointment.  Will follow up lab results.  I explained to patient that her ADHD and Anxiety disorder should continue to be treated by her psychiatrist as she is on high risk medications. The patient verbalized understanding.   Pt says she was recently prescribed fluoxetine 40 mg but she is not planning on taking it.  Pt strongly encouraged to follow up with her urologist as she has not followed up in over 2 years.  Also she was advised to see a dentist.    The patient was given clear instructions to go to ER or return to medical center if symptoms don't improve, worsen or new problems develop.  The patient verbalized understanding.  The patient was told to call to get lab results if they haven't heard anything in the next week.    Follow up scheduled  Rodney Langton, MD, CDE, FAAFP [facility] [place], [place]

## 2013-03-11 NOTE — Patient Instructions (Addendum)
Attention Deficit Hyperactivity Disorder Attention deficit hyperactivity disorder (ADHD) is a problem with behavior issues based on the way the brain functions (neurobehavioral disorder). It is a common reason for behavior and academic problems in school. CAUSES  The cause of ADHD is unknown in most cases. It may run in families. It sometimes can be associated with learning disabilities and other behavioral problems. SYMPTOMS  There are 3 types of ADHD. The 3 types and some of the symptoms include:  Inattentive  Gets bored or distracted easily.  Loses or forgets things. Forgets to hand in homework.  Has trouble organizing or completing tasks.  Difficulty staying on task.  An inability to organize daily tasks and school work.  Leaving projects, chores, or homework unfinished.  Trouble paying attention or responding to details. Careless mistakes.  Difficulty following directions. Often seems like is not listening.  Dislikes activities that require sustained attention (like chores or homework).  Hyperactive-impulsive  Feels like it is impossible to sit still or stay in a seat. Fidgeting with hands and feet.  Trouble waiting turn.  Talking too much or out of turn. Interruptive.  Speaks or acts impulsively.  Aggressive, disruptive behavior.  Constantly busy or on the go, noisy.  Combined  Has symptoms of both of the above. Often children with ADHD feel discouraged about themselves and with school. They often perform well below their abilities in school. These symptoms can cause problems in home, school, and in relationships with peers. As children get older, the excess motor activities can calm down, but the problems with paying attention and staying organized persist. Most children do not outgrow ADHD but with good treatment can learn to cope with the symptoms. DIAGNOSIS  When ADHD is suspected, the diagnosis should be made by professionals trained in ADHD.  Diagnosis will  include:  Ruling out other reasons for the child's behavior.  The caregivers will check with the child's school and check their medical records.  They will talk to teachers and parents.  Behavior rating scales for the child will be filled out by those dealing with the child on a daily basis. A diagnosis is made only after all information has been considered. TREATMENT  Treatment usually includes behavioral treatment often along with medicines. It may include stimulant medicines. The stimulant medicines decrease impulsivity and hyperactivity and increase attention. Other medicines used include antidepressants and certain blood pressure medicines. Most experts agree that treatment for ADHD should address all aspects of the child's functioning. Treatment should not be limited to the use of medicines alone. Treatment should include structured classroom management. The parents must receive education to address rewarding good behavior, discipline, and limit-setting. Tutoring or behavioral therapy or both should be available for the child. If untreated, the disorder can have long-term serious effects into adolescence and adulthood. HOME CARE INSTRUCTIONS   Often with ADHD there is a lot of frustration among the family in dealing with the illness. There is often blame and anger that is not warranted. This is a life long illness. There is no way to prevent ADHD. In many cases, because the problem affects the family as a whole, the entire family may need help. A therapist can help the family find better ways to handle the disruptive behaviors and promote change. If the child is young, most of the therapist's work is with the parents. Parents will learn techniques for coping with and improving their child's behavior. Sometimes only the child with the ADHD needs counseling. Your caregivers can help   you make these decisions.  Children with ADHD may need help in organizing. Some helpful tips include:  Keep  routines the same every day from wake-up time to bedtime. Schedule everything. This includes homework and playtime. This should include outdoor and indoor recreation. Keep the schedule on the refrigerator or a bulletin board where it is frequently seen. Mark schedule changes as far in advance as possible.  Have a place for everything and keep everything in its place. This includes clothing, backpacks, and school supplies.  Encourage writing down assignments and bringing home needed books.  Offer your child a well-balanced diet. Breakfast is especially important for school performance. Children should avoid drinks with caffeine including:  Soft drinks.  Coffee.  Tea.  However, some older children (adolescents) may find these drinks helpful in improving their attention.  Children with ADHD need consistent rules that they can understand and follow. If rules are followed, give small rewards. Children with ADHD often receive, and expect, criticism. Look for good behavior and praise it. Set realistic goals. Give clear instructions. Look for activities that can foster success and self-esteem. Make time for pleasant activities with your child. Give lots of affection.  Parents are their children's greatest advocates. Learn as much as possible about ADHD. This helps you become a stronger and better advocate for your child. It also helps you educate your child's teachers and instructors if they feel inadequate in these areas. Parent support groups are often helpful. A national group with local chapters is called CHADD (Children and Adults with Attention Deficit Hyperactivity Disorder). PROGNOSIS  There is no cure for ADHD. Children with the disorder seldom outgrow it. Many find adaptive ways to accommodate the ADHD as they mature. SEEK MEDICAL CARE IF:  Your child has repeated muscle twitches, cough or speech outbursts.  Your child has sleep problems.  Your child has a marked loss of  appetite.  Your child develops depression.  Your child has new or worsening behavioral problems.  Your child develops dizziness.  Your child has a racing heart.  Your child has stomach pains.  Your child develops headaches. Document Released: 09/15/2002 Document Revised: 12/18/2011 Document Reviewed: 04/27/2008 [facility] Patient Information 2014 [place], [place]. Constipation, Adult Constipation is when a person has fewer than 3 bowel movements a week; has difficulty having a bowel movement; or has stools that are dry, hard, or larger than normal. As people grow older, constipation is more common. If you try to fix constipation with medicines that make you have a bowel movement (laxatives), the problem may get worse. Long-term laxative use may cause the muscles of the colon to become weak. A low-fiber diet, not taking in enough fluids, and taking certain medicines may make constipation worse. CAUSES   Certain medicines, such as antidepressants, pain medicine, iron supplements, antacids, and water pills.   Certain diseases, such as diabetes, irritable bowel syndrome (IBS), thyroid disease, or depression.   Not drinking enough water.   Not eating enough fiber-rich foods.   Stress or travel.  Lack of physical activity or exercise.  Not going to the restroom when there is the urge to have a bowel movement.  Ignoring the urge to have a bowel movement.  Using laxatives too much. SYMPTOMS   Having fewer than 3 bowel movements a week.   Straining to have a bowel movement.   Having hard, dry, or larger than normal stools.   Feeling full or bloated.   Pain in the lower abdomen.  Not feeling relief after  having a bowel movement. DIAGNOSIS  Your caregiver will take a medical history and perform a physical exam. Further testing may be done for severe constipation. Some tests may include:   A barium enema X-ray to examine your rectum, colon, and sometimes, your small  intestine.  A sigmoidoscopy to examine your lower colon.  A colonoscopy to examine your entire colon. TREATMENT  Treatment will depend on the severity of your constipation and what is causing it. Some dietary treatments include drinking more fluids and eating more fiber-rich foods. Lifestyle treatments may include regular exercise. If these diet and lifestyle recommendations do not help, your caregiver may recommend taking over-the-counter laxative medicines to help you have bowel movements. Prescription medicines may be prescribed if over-the-counter medicines do not work.  HOME CARE INSTRUCTIONS   Increase dietary fiber in your diet, such as fruits, vegetables, whole grains, and beans. Limit high-fat and processed sugars in your diet, such as [place] fries, hamburgers, cookies, candies, and soda.   A fiber supplement may be added to your diet if you cannot get enough fiber from foods.   Drink enough fluids to keep your urine clear or pale yellow.   Exercise regularly or as directed by your caregiver.   Go to the restroom when you have the urge to go. Do not hold it.  Only take medicines as directed by your caregiver. Do not take other medicines for constipation without talking to your caregiver first. SEEK IMMEDIATE MEDICAL CARE IF:   You have bright red blood in your stool.   Your constipation lasts for more than 4 days or gets worse.   You have abdominal or rectal pain.   You have thin, pencil-like stools.  You have unexplained weight loss. MAKE SURE YOU:   Understand these instructions.  Will watch your condition.  Will get help right away if you are not doing well or get worse. Document Released: 06/23/2004 Document Revised: 12/18/2011 Document Reviewed: 08/29/2011 [facility] Patient Information 2014 [place], [place].

## 2013-03-11 NOTE — Progress Notes (Signed)
Patient presents stating anxiety and asks for refill on xanax. She states she can not take the generic.

## 2013-03-12 ENCOUNTER — Telehealth: Payer: Self-pay | Admitting: *Deleted

## 2013-03-12 NOTE — Progress Notes (Signed)
03/12/13 Message left for patient via telephone that her cholesterol was little elevated. Recommend that she watch  her diet. Please call and schedule an appointment in 4 months. P.Milany Geck,RN BSN [facility]

## 2013-03-12 NOTE — Progress Notes (Signed)
Quick Note:  Please inform patient that her labs came back OK. Her cholesterol was a little elevated. Recommend that she watch her diet. Recheck in 4 months.   Rodney Langton, MD, CDE, FAAFP [facility] [place], [place]   ______

## 2013-09-10 ENCOUNTER — Ambulatory Visit: Payer: Medicaid Other

## 2014-01-07 ENCOUNTER — Emergency Department (HOSPITAL_COMMUNITY)
Admission: EM | Admit: 2014-01-07 | Discharge: 2014-01-07 | Disposition: A | Discharge status: Home / Self Care | Payer: Medicaid Other | Attending: Emergency Medicine | Admitting: Emergency Medicine

## 2014-01-07 ENCOUNTER — Encounter (HOSPITAL_COMMUNITY): Payer: Self-pay | Admitting: Emergency Medicine

## 2014-01-07 DIAGNOSIS — Y929 Unspecified place or not applicable: Secondary | ICD-10-CM | POA: Insufficient documentation

## 2014-01-07 DIAGNOSIS — F329 Major depressive disorder, single episode, unspecified: Secondary | ICD-10-CM | POA: Insufficient documentation

## 2014-01-07 DIAGNOSIS — S40269A Insect bite (nonvenomous) of unspecified shoulder, initial encounter: Secondary | ICD-10-CM | POA: Insufficient documentation

## 2014-01-07 DIAGNOSIS — Z79899 Other long term (current) drug therapy: Secondary | ICD-10-CM | POA: Insufficient documentation

## 2014-01-07 DIAGNOSIS — W57XXXA Bitten or stung by nonvenomous insect and other nonvenomous arthropods, initial encounter: Secondary | ICD-10-CM | POA: Insufficient documentation

## 2014-01-07 DIAGNOSIS — Y939 Activity, unspecified: Secondary | ICD-10-CM | POA: Insufficient documentation

## 2014-01-07 DIAGNOSIS — J45909 Unspecified asthma, uncomplicated: Secondary | ICD-10-CM | POA: Insufficient documentation

## 2014-01-07 DIAGNOSIS — F3289 Other specified depressive episodes: Secondary | ICD-10-CM | POA: Insufficient documentation

## 2014-01-07 DIAGNOSIS — S90569A Insect bite (nonvenomous), unspecified ankle, initial encounter: Secondary | ICD-10-CM | POA: Insufficient documentation

## 2014-01-07 DIAGNOSIS — F411 Generalized anxiety disorder: Secondary | ICD-10-CM | POA: Insufficient documentation

## 2014-01-07 MED ORDER — HYDROCORTISONE 0.5 % EX CREA: 1.0000 "application " | TOPICAL_CREAM | Freq: Two times a day (BID) | CUTANEOUS | Status: DC

## 2014-01-07 MED ORDER — HYDROCORTISONE 2 % EX LOTN: 1.0000 [in_us] | TOPICAL_LOTION | Freq: Two times a day (BID) | CUTANEOUS | Status: DC

## 2014-01-07 MED ORDER — DIPHENHYDRAMINE HCL 25 MG PO TABS: 25.0000 mg | ORAL_TABLET | Freq: Four times a day (QID) | ORAL | Status: DC | PRN

## 2014-01-07 MED ORDER — CEPHALEXIN 500 MG PO CAPS: 500.0000 mg | ORAL_CAPSULE | Freq: Four times a day (QID) | ORAL | Status: DC

## 2014-01-07 NOTE — ED Provider Notes (Signed)
CSN: [id number]     Arrival date & time 01/07/14  1901 History  This chart was scribed for non-physician practitioner, Roxy Horsemanobert Yevonne Yokum, PA-C working with Gilda Creasehristopher J. Pollina, MD by Greggory StallionKayla Andersen, ED scribe. This patient was seen in room WTR7/WTR7 and the patient's care was started at 7:35 PM.   Chief Complaint  Patient presents with  . Bug Bites    The history is provided by the patient. No language interpreter was used.   HPI Comments: Nicole Rangel is a 33 y.o. female who presents to the Emergency Department complaining of possible bug bites that started 2 days ago. She has them to her right arm, left arm and left thigh and states they are very itchy. Pt has used hydrocortisone and zyrtec with no relief. She states her house has already been treated for bed bugs.   Past Medical History  Diagnosis Date  . Allergy   . Asthma   . Anxiety   . Depression    No past surgical history on file. Family History  Problem Relation Age of Onset  . Diabetes Mother    History  Substance Use Topics  . Smoking status: Never Smoker   . Smokeless tobacco: Not on file  . Alcohol Use: Yes     Comment: occasional   OB History   Grav Para Term Preterm Abortions TAB SAB Ect Mult Living                 Review of Systems  Constitutional: Negative for fever.  HENT: Negative for congestion.   Eyes: Negative for redness.  Respiratory: Negative for shortness of breath.   Cardiovascular: Negative for chest pain.  Gastrointestinal: Negative for abdominal distention.  Musculoskeletal: Negative for gait problem.  Skin:       Bug bites.  Neurological: Negative for speech difficulty.  Psychiatric/Behavioral: Negative for confusion.   Allergies  Lidocaine  Home Medications   Current Outpatient Rx  Name  Route  Sig  Dispense  Refill  . ALPRAZolam (XANAX) 1 MG tablet   Oral   Take 1 mg by mouth 3 (three) times daily as needed. anxiety         . amphetamine-dextroamphetamine (ADDERALL) 15  MG tablet   Oral   Take 15 mg by mouth daily as needed. Only takes on school days.         Marland Kitchen. FLUoxetine (PROZAC) 40 MG capsule   Oral   Take 1 capsule (40 mg total) by mouth daily.   90 capsule   3   . ibuprofen (ADVIL,MOTRIN) 200 MG tablet   Oral   Take 400 mg by mouth every 6 (six) hours as needed. For pain         . Multiple Vitamin (MULITIVITAMIN WITH MINERALS) TABS   Oral   Take 1 tablet by mouth daily.         . Omega-3 Fatty Acids (OMEGA 3 PO)   Oral   Take 1 capsule by mouth daily.          BP 132/91  Pulse 99  Temp(Src) 98.8 F (37.1 C) (Oral)  Resp 16  SpO2 100%  Physical Exam  Nursing note and vitals reviewed. Constitutional: She is oriented to person, place, and time. She appears well-developed and well-nourished. No distress.  HENT:  Head: Normocephalic and atraumatic.  Eyes: Conjunctivae and EOM are normal.  Cardiovascular: Normal rate and regular rhythm.   Pulmonary/Chest: Effort normal and breath sounds normal. No stridor. No respiratory distress.  Abdominal: She exhibits no distension.  Musculoskeletal: She exhibits no edema.  Neurological: She is alert and oriented to person, place, and time. No cranial nerve deficit.  Skin: Skin is warm and dry.  5-6 bug bites on left arm. No evidence of cellulitis or abscess. Right upper arm remarkable for 3 x 3 cm area of erythema. Does not appear to be cellulitic. No abscess.   Psychiatric: She has a normal mood and affect.    ED Course  Procedures (including critical care time)  DIAGNOSTIC STUDIES: Oxygen Saturation is 100% on RA, normal by my interpretation.    COORDINATION OF CARE: 7:38 PM-Discussed treatment plan which includes benadryl and keflex with pt at bedside and pt agreed to plan. Return precautions given.   Labs Review Labs Reviewed - No data to display Imaging Review No results found.   EKG Interpretation None      MDM   Final diagnoses:  Bug bites    Patient with bug  bites. Afebrile. No signs of cellulitis. Discharged home. Return cautions given. Patient is stable and ready for discharge. I personally performed the services described in this documentation, which was scribed in my presence. The recorded information has been reviewed and is accurate.    Roxy Horseman, PA-C 01/07/14 1941

## 2014-01-07 NOTE — Discharge Instructions (Signed)
Insect Bite  Mosquitoes, flies, fleas, bedbugs, and many other insects can bite. Insect bites are different from insect stings. A sting is when venom is injected into the skin. Some insect bites can transmit infectious diseases.  SYMPTOMS   Insect bites usually turn red, swell, and itch for 2 to 4 days. They often go away on their own.  TREATMENT   Your caregiver may prescribe antibiotic medicines if a bacterial infection develops in the bite.  HOME CARE INSTRUCTIONS   Do not scratch the bite area.   Keep the bite area clean and dry. Wash the bite area thoroughly with soap and water.   Put ice or cool compresses on the bite area.   Put ice in a plastic bag.   Place a towel between your skin and the bag.   Leave the ice on for 20 minutes, 4 times a day for the first 2 to 3 days, or as directed.   You may apply a baking soda paste, cortisone cream, or calamine lotion to the bite area as directed by your caregiver. This can help reduce itching and swelling.   Only take over-the-counter or prescription medicines as directed by your caregiver.   If you are given antibiotics, take them as directed. Finish them even if you start to feel better.  You may need a tetanus shot if:   You cannot remember when you had your last tetanus shot.   You have never had a tetanus shot.   The injury broke your skin.  If you get a tetanus shot, your arm may swell, get red, and feel warm to the touch. This is common and not a problem. If you need a tetanus shot and you choose not to have one, there is a rare chance of getting tetanus. Sickness from tetanus can be serious.  SEEK IMMEDIATE MEDICAL CARE IF:    You have increased pain, redness, or swelling in the bite area.   You see a red line on the skin coming from the bite.   You have a fever.   You have joint pain.   You have a headache or neck pain.   You have unusual weakness.   You have a rash.   You have chest pain or shortness of breath.    You have abdominal pain, nausea, or vomiting.   You feel unusually tired or sleepy.  MAKE SURE YOU:    Understand these instructions.   Will watch your condition.   Will get help right away if you are not doing well or get worse.  Document Released: 11/02/2004 Document Revised: 12/18/2011 Document Reviewed: 04/26/2011  [employer] Patient Information 2014 [employer].

## 2014-01-07 NOTE — ED Notes (Signed)
Pt has several red raised itchy areas to L arm x 2 days. Now she has a larger area on her R arm. Pt states they might be bug bites and she also has some on her L thigh. States she has used hydrocortisone cream, but it is not helping with the itching a lot. Pt with no acute distress.

## 2014-01-07 NOTE — ED Provider Notes (Signed)
Medical screening examination/treatment/procedure(s) were performed by non-physician practitioner and as supervising physician I was immediately available for consultation/collaboration.   EKG Interpretation None        Clayborn Milnes J. Esias Mory, MD 01/07/14 1945 

## 2014-02-02 ENCOUNTER — Ambulatory Visit: Payer: Medicaid Other | Attending: Urology | Admitting: Physical Therapy

## 2014-03-13 ENCOUNTER — Other Ambulatory Visit: Payer: Self-pay | Admitting: Nurse Practitioner

## 2014-03-13 DIAGNOSIS — R519 Headache, unspecified: Secondary | ICD-10-CM

## 2014-03-13 DIAGNOSIS — R51 Headache: Principal | ICD-10-CM

## 2014-03-16 ENCOUNTER — Ambulatory Visit
Admission: RE | Admit: 2014-03-16 | Discharge: 2014-03-16 | Disposition: A | Discharge status: Home / Self Care | Payer: Medicaid Other | Source: Ambulatory Visit | Attending: Nurse Practitioner | Admitting: Nurse Practitioner

## 2014-03-16 DIAGNOSIS — R519 Headache, unspecified: Secondary | ICD-10-CM

## 2014-03-16 DIAGNOSIS — R51 Headache: Principal | ICD-10-CM

## 2014-03-17 ENCOUNTER — Other Ambulatory Visit: Payer: Medicaid Other

## 2014-03-19 ENCOUNTER — Ambulatory Visit: Payer: Medicaid Other | Attending: Urology | Admitting: Physical Therapy

## 2014-03-19 ENCOUNTER — Other Ambulatory Visit: Payer: Medicaid Other

## 2014-03-19 DIAGNOSIS — IMO0001 Reserved for inherently not codable concepts without codable children: Secondary | ICD-10-CM | POA: Insufficient documentation

## 2014-03-19 DIAGNOSIS — R32 Unspecified urinary incontinence: Secondary | ICD-10-CM | POA: Diagnosis not present

## 2014-03-19 DIAGNOSIS — F319 Bipolar disorder, unspecified: Secondary | ICD-10-CM | POA: Diagnosis not present

## 2014-03-19 DIAGNOSIS — F411 Generalized anxiety disorder: Secondary | ICD-10-CM | POA: Insufficient documentation

## 2014-03-20 ENCOUNTER — Encounter: Payer: Self-pay | Admitting: *Deleted

## 2014-03-23 ENCOUNTER — Ambulatory Visit (INDEPENDENT_AMBULATORY_CARE_PROVIDER_SITE_OTHER): Payer: Medicaid Other | Admitting: Neurology

## 2014-03-23 ENCOUNTER — Encounter: Payer: Self-pay | Admitting: Neurology

## 2014-03-23 VITALS — BP 104/68 | Ht 67.25 in | Wt 267.0 lb

## 2014-03-23 DIAGNOSIS — R519 Headache, unspecified: Secondary | ICD-10-CM | POA: Insufficient documentation

## 2014-03-23 DIAGNOSIS — R51 Headache: Secondary | ICD-10-CM | POA: Insufficient documentation

## 2014-03-23 DIAGNOSIS — M5481 Occipital neuralgia: Secondary | ICD-10-CM

## 2014-03-23 DIAGNOSIS — M531 Cervicobrachial syndrome: Secondary | ICD-10-CM

## 2014-03-23 MED ORDER — GABAPENTIN 300 MG PO CAPS: ORAL_CAPSULE | ORAL | Status: DC

## 2014-03-23 MED ORDER — METHYLPREDNISOLONE (PAK) 4 MG PO TABS: ORAL_TABLET | ORAL | Status: DC

## 2014-03-23 NOTE — Progress Notes (Signed)
[facility]    Provider:  Dr Hosie PoissonSumner Referring Provider: Estevan OaksBrown, Beverly Ann, NP Primary Care Physician:  Jeanann LewandowskyJEGEDE, OLUGBEMIGA, MD  CC:  headaches  HPI:  Nicole Rangel is a 33 y.o. female here as a referral from Dr. Manson PasseyBrown for headaches  Has history of migraines. Current headache started 4 weeks ago and has been constant. Started with pain in the back of her neck, currently now has severe pain in one area of the back of her head. She is able to localize the area with one finger and is in the area of the left greater occipital nerve. t Described as a stabbing type pain. Worse with movement or lying on the back of her head. Pain is non-radiating but will sometimes radiate over the back of her head. No nausea or emesis. +Photo and phonophobia. Occasional blurry vision but otherwise vision is normal. No recent fevers, illnesses, infection. No history of head or neck trauma. Has tried tylenol, aleve, ibuprofen, percocet, vicodin, maxalt with no relief. Notes around 5 to 10lb recent weight gain. Had normal head CT (images reviewed).   Current headache is different than the prior migraines she has had.   Review of Systems: Out of a complete 14 system review, the patient complains of only the following symptoms, and all other reviewed systems are negative. Positive headache numbness sleepiness depression anxiety dizziness ringing in ears  History   Social History  . Marital Status: Single    Spouse Name: N/A    Number of Children: N/A  . Years of Education: N/A   Occupational History  . Not on file.   Social History Main Topics  . Smoking status: Never Smoker   . Smokeless tobacco: Never Used  . Alcohol Use: Yes     Comment: occasional  . Drug Use: No  . Sexual Activity: Not on file   Other Topics Concern  . Not on file   Social History Narrative   Patient is single with 1 child   Patient is right handed   Patient's education level is Bachelor's degree   Patient doesn't drink caffeine, only caffeine free    Family History  Problem Relation Age of Onset  . Diabetes Mother     Past Medical History  Diagnosis Date  . Allergy   . Asthma   . Anxiety   . Depression     No past surgical history on file.  Current Outpatient Prescriptions  Medication Sig Dispense Refill  . amphetamine-dextroamphetamine (ADDERALL XR) 30 MG 24 hr capsule Take 30 mg by mouth daily.      . Cholecalciferol (VITAMIN D3 PO) Take 4,000 Units by mouth.      . Cyclobenzaprine HCl (FLEXERIL PO) Take by mouth.      Marland Kitchen. HYDROCODONE BITARTRATE PO Take by mouth.      . hydrocortisone cream 0.5 % Apply 1 application topically 2 (two) times daily.  30 g  0  . HYDROCORTISONE, TOPICAL, 2 % LOTN Apply 1 inch topically 2 (two) times daily.  1 Bottle  0  . ibuprofen (ADVIL,MOTRIN) 200 MG tablet Take 600 mg by mouth every 6 (six) hours as needed (pain). For pain      . Multiple Vitamin (MULITIVITAMIN WITH MINERALS) TABS Take 1 tablet by mouth daily.      . norgestimate-ethinyl estradiol (ORTHO-CYCLEN,SPRINTEC,PREVIFEM) 0.25-35 MG-MCG tablet Take 1 tablet by mouth daily.      . Omega-3 Fatty Acids (OMEGA 3 PO) Take 1 capsule by mouth daily.      .Marland Kitchen  Oxycodone-Acetaminophen (PERCOCET PO) Take by mouth.      . QUEtiapine (SEROQUEL) 300 MG tablet Take 300 mg by mouth at bedtime.      . sertraline (ZOLOFT) 100 MG tablet Take 150 mg by mouth daily. Takes 1.5 tablet      . solifenacin (VESICARE) 5 MG tablet Take 5 mg by mouth daily.      Marland Kitchen. triamcinolone ointment (KENALOG) 0.1 % Apply 1 application topically 2 (two) times daily.       No current facility-administered medications for this visit.    Allergies as of 03/23/2014 - Review Complete 03/23/2014  Allergen Reaction Noted  . Lidocaine Swelling 01/09/2012    Vitals: BP 104/68  Ht 5' 7.25" (1.708 m)  Wt 267 lb (121.11 kg)  BMI 41.51 kg/m2  LMP 02/23/2014 Last Weight:  Wt Readings from Last 1 Encounters:  03/23/14 267 lb  (121.11 kg)   Last Height:   Ht Readings from Last 1 Encounters:  03/23/14 5' 7.25" (1.708 m)     Physical exam: Exam: Gen: NAD, conversant Eyes: anicteric sclerae, moist conjunctivae HENT: Atraumatic, oropharynx clear, tenderness to palpation in area of left greater occipital nerve Neck: Trachea midline; supple,  Lungs: CTA, no wheezing, rales, rhonic                          CV: RRR, no MRG Abdomen: Soft, non-tender;  Extremities: No peripheral edema  Skin: Normal temperature, no rash,  Psych: Appropriate affect, pleasant  Neuro: MS: AA&Ox3, appropriately interactive, normal affect   Attention: WORLD backwards  Speech: fluent w/o paraphasic error  Memory: good recent and remote recall  CN: PERRL, visual fields full to finger count bilaterally, funduscopic exam unable to fully visualize do to pupil size, EOMI no nystagmus, no ptosis, sensation intact to LT V1-V3 bilat, face symmetric, no weakness, hearing grossly intact, palate elevates symmetrically, shoulder shrug 5/5 bilat,  tongue protrudes midline, no fasiculations noted.  Motor: normal bulk and tone Strength: 5/5  In all extremities  Coord: rapid alternating and point-to-point (FNF, HTS) movements intact.  Reflexes: symmetrical, bilat downgoing toes  Sens: LT intact in all extremities  Gait: posture, stance, stride and arm-swing normal. Tandem gait intact. Able to walk on heels and toes. Romberg absent.   Assessment:  After physical and neurologic examination, review of laboratory studies, imaging, neurophysiology testing and pre-existing records, assessment will be reviewed on the problem list.  Plan:  Treatment plan and additional workup will be reviewed under Problem List.  1) headache  33 year old woman presenting for initial evaluation of 4 weeks of constant headache. Based on clinical description and history this is most consistent with a diagnosis of occipital neuralgia. She has tenderness to  palpation over the area of the left greater occipital nerve. Due to frequent/constant nature of her headache we will check a MRI/V of the brain. Will start prednisone taper pack and gabapentin 300mg  TID. If no improvement will consider a GON block. Will follow up once MRI completed.   Elspeth ChoPeter Akiva Josey, DO  [facility] [address] [place], [place] [id number]  Phone [phone] Fax [phone]

## 2014-03-24 ENCOUNTER — Telehealth: Payer: Self-pay | Admitting: Neurology

## 2014-03-24 NOTE — Telephone Encounter (Signed)
Patient calling to state that the pharmacist said she shouldn't take Gabapentin because of the other current medications she is taking, just wants to make sure with Dr. Hosie PoissonSumner that he has taken into account her current medications. Please return call to patient and advise.

## 2014-03-24 NOTE — Telephone Encounter (Signed)
I called the pharmacy to inquire what meds they are referring to.  Spoke with Crystal.  She said she did not see any issues, and in fact, the patient already picked up the Rx yesterday.  I called the patient back.  She is currently this taking medication and will call us back if any issues arise.

## 2014-03-24 NOTE — Telephone Encounter (Signed)
Pt calling stating that the pharmacist advised pt not to take gabapentin because of the other medications pt is taking. Pt has questions. Please advise

## 2014-04-01 ENCOUNTER — Ambulatory Visit
Admission: RE | Admit: 2014-04-01 | Discharge: 2014-04-01 | Disposition: A | Discharge status: Home / Self Care | Payer: Medicaid Other | Source: Ambulatory Visit | Attending: Neurology | Admitting: Neurology

## 2014-04-01 ENCOUNTER — Other Ambulatory Visit: Payer: Medicaid Other

## 2014-04-01 DIAGNOSIS — R51 Headache: Secondary | ICD-10-CM

## 2014-04-06 ENCOUNTER — Telehealth: Payer: Self-pay | Admitting: *Deleted

## 2014-04-06 NOTE — Telephone Encounter (Signed)
Message copied by Ardeth SportsmanHODES, STEPHANIE L on Mon Apr 06, 2014  3:51 PM ------      Message from: Ramond MarrowSUMNER, PETER J      Created: Mon Apr 06, 2014  2:56 PM       Please let her know her MRV was normal. She should continue on the prescribed medication for now. Thanks. ------

## 2014-04-06 NOTE — Telephone Encounter (Signed)
Spoke to patient and she is aware of normal results and to continue her prescribed medication.

## 2014-04-16 ENCOUNTER — Telehealth: Payer: Self-pay | Admitting: Neurology

## 2014-04-16 NOTE — Telephone Encounter (Signed)
Patient calling wanting to schedule an appointment with Dr. Hosie PoissonSumner as soon as possible, states that her migraine has been getting worse for the past 2 days, please return call to patient and advise.

## 2015-06-22 ENCOUNTER — Encounter: Payer: Self-pay | Admitting: Neurology

## 2015-06-22 ENCOUNTER — Ambulatory Visit (INDEPENDENT_AMBULATORY_CARE_PROVIDER_SITE_OTHER): Payer: Medicaid Other | Admitting: Neurology

## 2015-06-22 VITALS — BP 122/88 | HR 100 | Resp 16 | Ht 67.0 in | Wt 242.0 lb

## 2015-06-22 DIAGNOSIS — G4719 Other hypersomnia: Secondary | ICD-10-CM | POA: Diagnosis not present

## 2015-06-22 DIAGNOSIS — G478 Other sleep disorders: Secondary | ICD-10-CM

## 2015-06-22 DIAGNOSIS — R0683 Snoring: Secondary | ICD-10-CM

## 2015-06-22 DIAGNOSIS — R51 Headache: Secondary | ICD-10-CM | POA: Diagnosis not present

## 2015-06-22 DIAGNOSIS — R519 Headache, unspecified: Secondary | ICD-10-CM

## 2015-06-22 NOTE — Patient Instructions (Signed)

## 2015-06-22 NOTE — Progress Notes (Signed)
Subjective:    Patient ID: Nicole Rangel is a 34 y.o. female.  HPI     Nicole Foley, MD, PhD [facility] [address], [address] [address] [place], [place] [address]  Dear Nicole Rangel,   I saw your patient, Nicole Rangel, upon your kind request, in my neurologic clinic today for initial consultation of her sleep disorder, in particular, concern for obstructive sleep apnea. The patient is accompanied by her daughter, Nicole Rangel, today. As you know, Nicole Rangel is a 34 year old right-handed woman with an underlying medical history of ADD HD, panic disorder, mood disorder, recurrent headaches, and severe obesity, who reports snoring and excessive daytime somnolence and waking up with a cough, recurrent nighttime awakenings and morning headaches as well as nonrestorative sleep. Her Epworth sleepiness score is 11 out of 24 today, her fatigue score is 49 out of 63. Symptoms have been ongoing for years, worse in the last 2 years. She has trouble falling asleep and staying asleep.  She had previously seen Nicole Rangel in our office on 03/23/2014 for recurrent headaches, she had an MRV brain without contrast on 04/01/2014 which was reported as normal. She was contacted by phone with the test results. She had a CT head without contrast on 03/16/2014 which was reported as normal.   Her bedtime is around 11 PM. It may take her 1-1/2-2 hours to fall asleep. She lives with her daughter, who has a separate bedroom. Her snoring can be loud. She has a headache sometimes in the mornings, about once a week. She has occasional nocturia, not on a night to night basis. She denies frank restless leg symptoms and is not sure if she twitches her legs in her sleep. She has tried to lose weight and lost some but gained it all back and some more. She has no family history of obstructive sleep apnea. Her daughter has recently had a sleep study but results are pending. For her sleep onset and maintenance difficulties she  has tried over-the-counter medications including Benadryl 25-50 mg, Unisom 1-2 pills, melatonin, sleepy time tea, warm milk with honey and so forth, and has not found anything effective in a sustained fashion. She is still taking Benadryl generic 25 mg each night. She takes her stimulant at 10 AM in the morning. She takes Zoloft also during the day. She is currently not working. She has a degree in social work. 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 She is usually up by 6 AM. She has multiple nighttime awakenings without overt reason. She typically lays in bed until 10 AM. She drinks alcohol occasionally, caffeine occasionally, and is a nonsmoker.  Her Past Medical History Is Significant For: Past Medical History  Diagnosis Date  . Allergy   . Asthma   . Anxiety   . Depression   . ADHD (attention deficit hyperactivity disorder)   . Panic disorder   . Mood disorder   . Headache   . Bipolar 1 disorder     Her Past Surgical History Is Significant For: No past surgical history on file.  Her Family History Is Significant For: Family History  Problem Relation Age of Onset  . Diabetes Mother     Her Social History Is Significant For: Social History   Social History  . Marital Status: Single    Spouse Name: N/A  . Number of Children: 1  . Years of Education: BS   Occupational History  . N/A    Social History Main Topics  .  Smoking status: Never Smoker   . Smokeless tobacco: Never Used  . Alcohol Use: 0.0 oz/week    0 Standard drinks or equivalent per week     Comment: occasional  . Drug Use: No  . Sexual Activity: Not Asked   Other Topics Concern  . None   Social History Narrative   Patient is single with 1 child   Patient is right handed   Patient's education level is Bachelor's degree   Patient doesn't drink caffeine, only caffeine free    Her Allergies Are:  Allergies  Allergen Reactions  . Lidocaine Swelling    To face.  :   Her Current Medications Are:   Outpatient Encounter Prescriptions as of 06/22/2015  Medication Sig  . amphetamine-dextroamphetamine (ADDERALL XR) 30 MG 24 hr capsule Take 30 mg by mouth daily.  . busPIRone (BUSPAR) 10 MG tablet TAKE 1 TABLET 2-3 TIMES DAILY  . cholecalciferol (VITAMIN D) 1000 UNITS tablet Take 1,000 Units by mouth daily.  Marland Kitchen KRILL OIL PO Take by mouth.  . levothyroxine (SYNTHROID, LEVOTHROID) 75 MCG tablet Take 75 mcg by mouth daily before breakfast.  . Multiple Vitamin (MULTIVITAMIN) capsule Take 1 capsule by mouth daily.  . sertraline (ZOLOFT) 100 MG tablet Take 100 mg by mouth daily.  . SPRINTEC 28 0.25-35 MG-MCG tablet TAKE 1 TABLET(S) EVERY DAY BY ORAL ROUTE.  Marland Kitchen Cyclobenzaprine HCl (FLEXERIL PO) Take by mouth.  . [DISCONTINUED] Brexpiprazole (REXULTI) 2 MG TABS Take by mouth.  . [DISCONTINUED] busPIRone (BUSPAR) 5 MG tablet Take 5 mg by mouth 3 (three) times daily.  . [DISCONTINUED] clonazePAM (KLONOPIN) 1 MG tablet Take 1 mg by mouth 2 (two) times daily.  . [DISCONTINUED] sertraline (ZOLOFT) 100 MG tablet Take 150 mg by mouth daily. Takes 1.5 tablet   No facility-administered encounter medications on file as of 06/22/2015.  :  Review of Systems:  Out of a complete 14 point review of systems, all are reviewed and negative with the exception of these symptoms as listed below:   Review of Systems  Constitutional: Positive for fatigue.       Weight gain   Respiratory: Positive for cough.   Neurological: Positive for dizziness and headaches.       Trouble falling and staying asleep, snoring, reports waking up in night coughing, wakes up in the morning feeling tired, takes naps during day, morning headaches, daytime tiredness.   Psychiatric/Behavioral:       Anxiety, not enough sleep     Objective:  Neurologic Exam  Physical Exam Physical Examination:   Filed Vitals:   06/22/15 1522  BP: 122/88  Pulse: 100  Resp: 16    General Examination: The patient is a very pleasant 34 y.o. female  in no acute distress. She appears well-developed and well-nourished and well groomed. She is obese.  HEENT: Normocephalic, atraumatic, pupils are equal, round and reactive to light and accommodation. Funduscopic exam is normal with sharp disc margins noted. Extraocular tracking is good without limitation to gaze excursion or nystagmus noted. Normal smooth pursuit is noted. Hearing is grossly intact. Tympanic membranes are clear bilaterally. Face is symmetric with normal facial animation and normal facial sensation. Speech is clear with no dysarthria noted. There is no hypophonia. There is no lip, neck/head, jaw or voice tremor. Neck is supple with full range of passive and active motion. There are no carotid bruits on auscultation. Oropharynx exam reveals: mild mouth dryness, good dental hygiene and mild airway crowding, due to larger tongue. Mallampati  is class I. Tongue protrudes centrally and palate elevates symmetrically. Tonsils are small bilaterally. Neck circumference is 14 and three-quarter inches. She has a minimal overbite of the slight crossbite.   Chest: Clear to auscultation without wheezing, rhonchi or crackles noted.  Heart: S1+S2+0, regular and normal without murmurs, rubs or gallops noted.   Abdomen: Soft, non-tender and non-distended with normal bowel sounds appreciated on auscultation.  Extremities: There is no pitting edema in the distal lower extremities bilaterally. Pedal pulses are intact.  Skin: Warm and dry without trophic changes noted. There are no varicose veins.  Musculoskeletal: exam reveals no obvious joint deformities, tenderness or joint swelling or erythema.   Neurologically:  Mental status: The patient is awake, alert and oriented in all 4 spheres. Her immediate and remote memory, attention, language skills and fund of knowledge are appropriate. There is no evidence of aphasia, agnosia, apraxia or anomia. Speech is clear with normal prosody and enunciation. Thought  process is linear. Mood is normal and affect is normal.  Cranial nerves II - XII are as described above under HEENT exam. In addition: shoulder shrug is normal with equal shoulder height noted. Motor exam: Normal bulk, strength and tone is noted. There is no drift, tremor or rebound. Romberg is negative. Reflexes are 2+ throughout. Babinski: Toes are flexor bilaterally. Fine motor skills and coordination: intact with normal finger taps, normal hand movements, normal rapid alternating patting, normal foot taps and normal foot agility.  Cerebellar testing: No dysmetria or intention tremor on finger to nose testing. Heel to shin is unremarkable bilaterally. There is no truncal or gait ataxia.  Sensory exam: intact to light touch, pinprick, vibration, temperature sense in the upper and lower extremities.  Gait, station and balance: She stands easily. No veering to one side is noted. No leaning to one side is noted. Posture is age-appropriate and stance is narrow based. Gait shows normal stride length and normal pace. No problems turning are noted. She turns en bloc. Tandem walk is unremarkable.   Assessment and Plan:   In summary, Nicole Rangel is a very pleasant 34 y.o.-year old female with an underlying medical history of ADD HD, panic disorder, mood disorder, recurrent headaches, and severe obesity, who reports snoring and excessive daytime somnolence and waking up with a cough, recurrent nighttime awakenings and morning headaches as well as nonrestorative sleep. Her history and physical exam are indeed concerning for obstructive sleep apnea (OSA). I had a long chat with the patient and Nicole Rangel about my findings and the diagnosis of OSA, its prognosis and treatment options. We talked about medical treatments, surgical interventions and non-pharmacological approaches. I explained in particular the risks and ramifications of untreated moderate to severe OSA, especially with respect to developing  cardiovascular disease down the Road, including congestive heart failure, difficult to treat hypertension, cardiac arrhythmias, or stroke. Even type 2 diabetes has, in part, been linked to untreated OSA. Symptoms of untreated OSA include daytime sleepiness, memory problems, mood irritability and mood disorder such as depression and anxiety, lack of energy, as well as recurrent headaches, especially morning headaches. We talked about trying to maintain a healthy lifestyle in general, as well as the importance of weight control. I encouraged the patient to eat healthy, exercise daily and keep well hydrated, to keep a scheduled bedtime and wake time routine, to not skip any meals and eat healthy snacks in between meals. I advised the patient not to drive when feeling sleepy. I recommended the following at this time:  sleep study with potential positive airway pressure titration. (We will score hypopneas at 4% and split the sleep study into diagnostic and treatment portion, if the estimated. 2 hour AHI is >15/h).   I explained the sleep test procedure to the patient and also outlined possible surgical and non-surgical treatment options of OSA, including the use of a custom-made dental device (which would require a referral to a specialist dentist or oral surgeon), upper airway surgical options, such as pillar implants, radiofrequency surgery, tongue base surgery, and UPPP (which would involve a referral to an ENT surgeon). Rarely, jaw surgery such as mandibular advancement may be considered.  I also explained the CPAP treatment option to the patient, who indicated that she would be willing to try CPAP if the need arises. I explained the importance of being compliant with PAP treatment, not only for insurance purposes but primarily to improve Her symptoms, and for the patient's long term health benefit, including to reduce Her cardiovascular risks. I answered all their questions today and the patient and her daughter  were in agreement. I would like to see her back after the sleep study is completed and encouraged her to call with any interim questions, concerns, problems or updates.   Thank you very much for allowing me to participate in the care of this nice patient. If I can be of any further assistance to you please do not hesitate to call me at [phone].  Sincerely,   Nicole Foley, MD, PhD

## 2015-06-23 ENCOUNTER — Ambulatory Visit (INDEPENDENT_AMBULATORY_CARE_PROVIDER_SITE_OTHER): Payer: Medicaid Other | Admitting: Neurology

## 2015-06-23 DIAGNOSIS — G4720 Circadian rhythm sleep disorder, unspecified type: Secondary | ICD-10-CM

## 2015-06-23 DIAGNOSIS — R0683 Snoring: Secondary | ICD-10-CM

## 2015-06-23 DIAGNOSIS — G479 Sleep disorder, unspecified: Secondary | ICD-10-CM

## 2015-06-24 NOTE — Sleep Study (Signed)
Please see the scanned sleep study interpretation located in the Procedure tab within the Chart Review section. 

## 2015-06-25 ENCOUNTER — Telehealth: Payer: Self-pay | Admitting: Neurology

## 2015-06-25 NOTE — Telephone Encounter (Signed)
Patient referred by her psychiatry PA, Tamela Oddi, seen by me on 06/22/15, PSG on 06/23/15:  Please call and notify the patient that the recent sleep study did not show any significant obstructive sleep apnea, significant leg twitching/kicking or any significant oxygen desaturations, but: she did not sleep very well, achieved no deep sleep, no dream sleep.  I would recommend pursuing good sleep hygiene, which means: Keep a regular sleep and wake schedule, try not to exercise or have a meal within 2 hours of your bedtime, try to keep your bedroom conducive for sleep, that is, cool and dark, without light distractors such as an illuminated alarm clock, and refrain from watching TV right before sleep or in the middle of the night and do not keep the TV or radio on during the night. Also, try not to use or play on electronic devices at bedtime, such as your cell phone, tablet PC or laptop. If you like to read at bedtime on an electronic device, try to dim the background light as much as possible. Do not eat in the middle of the night.   Please inform patient that we can play it by ear or we can make a FU appt to go over the details of the study, whatever her preference. Also, route or fax report to PCP and referring MD, if other than PCP.  Once you have spoken to patient, you can close this encounter.   Thanks,  Huston Foley, MD, PhD [facility])

## 2015-07-01 NOTE — Telephone Encounter (Signed)
I spoke to patient and she is aware of results and recommendation. She requested a f/u appt with Dr. Frances Furbish to discuss the sleep study. I will fax report to referring provider.

## 2015-07-01 NOTE — Telephone Encounter (Signed)
Left message to call back  

## 2015-07-05 ENCOUNTER — Encounter: Payer: Self-pay | Admitting: Neurology

## 2015-07-05 ENCOUNTER — Ambulatory Visit (INDEPENDENT_AMBULATORY_CARE_PROVIDER_SITE_OTHER): Payer: Medicaid Other | Admitting: Neurology

## 2015-07-05 VITALS — BP 128/88 | HR 78 | Resp 16 | Ht 67.0 in | Wt 238.0 lb

## 2015-07-05 DIAGNOSIS — G4700 Insomnia, unspecified: Secondary | ICD-10-CM | POA: Diagnosis not present

## 2015-07-05 NOTE — Patient Instructions (Signed)
Your sleep study did not show any significant sleep disorder such as obstructive sleep apnea or significant leg twitching. You did not sleep very well and did not achieve any deep sleep or dream sleep.  You can try Melatonin at night for sleep: take 3 to 5 mg one to 2 hours before your bedtime.   Please talk to your psychologist about pursuing cognitive behavioral therapy for insomnia.  At this point, I can see you back as needed.  Please remember to try to maintain good sleep hygiene, which means: Keep a regular sleep and wake schedule, try not to exercise or have a meal within 2 hours of your bedtime, try to keep your bedroom conducive for sleep, that is, cool and dark, without light distractors such as an illuminated alarm clock, and refrain from watching TV right before sleep or in the middle of the night and do not keep the TV or radio on during the night. Also, try not to use or play on electronic devices at bedtime, such as your cell phone, tablet PC or laptop. If you like to read at bedtime on an electronic device, try to dim the background light as much as possible. Do not eat in the middle of the night.

## 2015-07-05 NOTE — Progress Notes (Signed)
Subjective:    Patient ID: Nicole Rangel is a 34 y.o. female.  HPI    Interim history:   Ms. Nicole Rangel is a 34 year old right-handed woman with an underlying medical history of ADHD, panic disorder, mood disorder, recurrent headaches, and severe obesity, who presents for follow-up consultation after her recent sleep study. The patient is unaccompanied today. I first met her on 06/22/2015 at the request of psychiatric Laisa Larrick, at which time the patient reported snoring, nonrestorative sleep, excessive daytime somnolence as well as waking up with a cough and morning headaches. I invited her back for a sleep study. She had a baseline sleep study on 06/23/2015 underwent over her test results with her in detail today. Sleep efficiency was reduced at 73.6% with a latency to sleep of 43 minutes and wake after sleep onset of 88.5 minutes with severe sleep fragmentation noted. She had an elevated arousal index, based on spontaneous arousals primarily. She had an increased percentage of stage I sleep and an increased percentage of stage II sleep, absence of slow-wave sleep and absence of REM sleep. She had no significant PLMS, EKG or EEG changes. She had mild intermittent snoring. Total AHI was 1.1 per hour, based on 7 obstructive hypopneas. Average oxygen saturation was 96%, nadir was 88%.  Today, 07/05/2015: She reports doing about the same. She has trouble going to sleep and maintaining sleep. She has had no recent medication changes. In the past she tried Seroquel for sleep.   Previously:   06/22/2015: She reports snoring and excessive daytime somnolence and waking up with a cough, recurrent nighttime awakenings and morning headaches as well as nonrestorative sleep. Her Epworth sleepiness score is 11 out of 24 today, her fatigue score is 49 out of 63. Symptoms have been ongoing for years, worse in the last 2 years. She has trouble falling asleep and staying asleep.   She had previously seen Dr. Janann Colonel in  our office on 03/23/2014 for recurrent headaches, she had an MRV brain without contrast on 04/01/2014 which was reported as normal. She was contacted by phone with the test results. She had a CT head without contrast on 03/16/2014 which was reported as normal.   Her bedtime is around 11 PM. It may take her 1-1/2-2 hours to fall asleep. She lives with her daughter, who has a separate bedroom. Her snoring can be loud. She has a headache sometimes in the mornings, about once a week. She has occasional nocturia, not on a night to night basis. She denies frank restless leg symptoms and is not sure if she twitches her legs in her sleep. She has tried to lose weight and lost some but gained it all back and some more. She has no family history of obstructive sleep apnea. Her daughter has recently had a sleep study but results are pending. For her sleep onset and maintenance difficulties she has tried over-the-counter medications including Benadryl 25-50 mg, Unisom 1-2 pills, melatonin, sleepy time tea, warm milk with honey and so forth, and has not found anything effective in a sustained fashion. She is still taking Benadryl generic 25 mg each night. She takes her stimulant at 10 AM in the morning. She takes Zoloft also during the day. She is currently not working. She has a degree in social work. 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 She is usually up by 6 AM. She has multiple nighttime awakenings without overt reason. She typically lays in bed until 10 AM. She drinks alcohol occasionally, caffeine occasionally,  and is a nonsmoker.  Her Past Medical History Is Significant For: Past Medical History  Diagnosis Date  . Allergy   . Asthma   . Anxiety   . Depression   . ADHD (attention deficit hyperactivity disorder)   . Panic disorder   . Mood disorder   . Headache   . Bipolar 1 disorder     Her Past Surgical History Is Significant For: No past surgical history on file.  Her Family History Is Significant  For: Family History  Problem Relation Age of Onset  . Diabetes Mother     Her Social History Is Significant For: Social History   Social History  . Marital Status: Single    Spouse Name: N/A  . Number of Children: 1  . Years of Education: BS   Occupational History  . N/A    Social History Main Topics  . Smoking status: Never Smoker   . Smokeless tobacco: Never Used  . Alcohol Use: 0.0 oz/week    0 Standard drinks or equivalent per week     Comment: occasional  . Drug Use: No  . Sexual Activity: Not Asked   Other Topics Concern  . None   Social History Narrative   Patient is single with 1 child   Patient is right handed   Patient's education level is Bachelor's degree   Patient doesn't drink caffeine, only caffeine free    Her Allergies Are:  Allergies  Allergen Reactions  . Lidocaine Swelling    To face.  :   Her Current Medications Are:  Outpatient Encounter Prescriptions as of 07/05/2015  Medication Sig  . amphetamine-dextroamphetamine (ADDERALL XR) 30 MG 24 hr capsule Take 30 mg by mouth daily.  . busPIRone (BUSPAR) 10 MG tablet TAKE 1 TABLET 2-3 TIMES DAILY  . cholecalciferol (VITAMIN D) 1000 UNITS tablet Take 1,000 Units by mouth daily.  . Cyclobenzaprine HCl (FLEXERIL PO) Take by mouth.  Marland Kitchen KRILL OIL PO Take by mouth.  . levothyroxine (SYNTHROID, LEVOTHROID) 75 MCG tablet Take 75 mcg by mouth daily before breakfast.  . Multiple Vitamin (MULTIVITAMIN) capsule Take 1 capsule by mouth daily.  . sertraline (ZOLOFT) 100 MG tablet Take 100 mg by mouth daily.  . SPRINTEC 28 0.25-35 MG-MCG tablet TAKE 1 TABLET(S) EVERY DAY BY ORAL ROUTE.   No facility-administered encounter medications on file as of 07/05/2015.  :  Review of Systems:  Out of a complete 14 point review of systems, all are reviewed and negative with the exception of these symptoms as listed below:    Review of Systems  Neurological:       Patient is here to discuss sleep study.   Patient  reports daytime tiredness, trouble falling and staying asleep.     Objective:  Neurologic Exam  Physical Exam Physical Examination:   Filed Vitals:   07/05/15 1211  BP: 128/88  Pulse: 78  Resp: 16    General Examination: The patient is a very pleasant 34 y.o. female in no acute distress. She appears well-developed and well-nourished and well groomed. She is obese.  HEENT: Normocephalic, atraumatic. Face is symmetric with normal facial animation and normal facial sensation. Speech is clear with no dysarthria noted. There is no hypophonia. There is no lip, neck/head, jaw or voice tremor.   Chest: Clear to auscultation without wheezing, rhonchi or crackles noted.  Heart: S1+S2+0, regular and normal without murmurs, rubs or gallops noted.   Abdomen: Soft, non-tender and non-distended with normal bowel sounds appreciated on  auscultation.  Musculoskeletal: exam reveals no obvious joint deformities, tenderness or joint swelling or erythema.   Neurologically:  Mental status: The patient is awake, alert and oriented in all 4 spheres. Her immediate and remote memory, attention, language skills and fund of knowledge are appropriate. There is no evidence of aphasia, agnosia, apraxia or anomia. Speech is clear with normal prosody and enunciation. Thought process is linear. Mood is normal and affect is normal. Sensory exam: intact to light touch.  Gait, station and balance: She stands easily. No veering to one side is noted. No leaning to one side is noted. Posture is age-appropriate and stance is narrow based. Gait shows normal stride length and normal pace.   Assessment and Plan:   In summary, KEIMORA SWARTOUT is a very pleasant 34 year old female with an underlying medical history of ADD HD, panic disorder, mood disorder, recurrent headaches, and severe obesity, who presents for follow-up consultation after her recent sleep study. She had a baseline sleep study on 06/23/2015 and I talked her  about her test results. I explained to her that she did not have any significant obstructive sleep apnea or any overt organic sleep disorder such as PLMD. Unfortunately, she did not sleep well. She did not achieve any slow-wave sleep or REM sleep. Part of this could be situational and part of this could be medication effect. She is advised regarding maintaining good sleep hygiene. Her physical exam is nonfocal. She is reassured in that regard. While the absence of REM sleep may underestimate her sleep disordered breathing particularly if it is REM related, her overall AHI was normal which is reassuring. At this juncture, I suggested that she talk to her psychiatrist and her counselor about her sleep issues. She may benefit from cognitive behavioral therapy. She asked about Valium for sleep but I advised against it as it is addicting. At this point, I suggested a follow-up as needed with me. I answered all her questions and she was in agreement.  I spent 10 minutes in total face-to-face time with the patient, more than 50% of which was spent in counseling and coordination of care, reviewing test results, reviewing medication and discussing or reviewing the diagnosis of insomnia, its prognosis and treatment options.

## 2015-10-08 ENCOUNTER — Other Ambulatory Visit: Payer: Self-pay | Admitting: Internal Medicine

## 2015-10-08 DIAGNOSIS — N63 Unspecified lump in unspecified breast: Secondary | ICD-10-CM

## 2015-10-14 ENCOUNTER — Other Ambulatory Visit: Payer: Medicaid Other

## 2015-12-24 ENCOUNTER — Ambulatory Visit
Admission: RE | Admit: 2015-12-24 | Discharge: 2015-12-24 | Disposition: A | Discharge status: Home / Self Care | Payer: Medicaid Other | Source: Ambulatory Visit | Attending: Internal Medicine | Admitting: Internal Medicine

## 2015-12-24 DIAGNOSIS — N63 Unspecified lump in unspecified breast: Secondary | ICD-10-CM

## 2015-12-28 DIAGNOSIS — E039 Hypothyroidism, unspecified: Secondary | ICD-10-CM | POA: Insufficient documentation

## 2016-03-19 DIAGNOSIS — R7303 Prediabetes: Secondary | ICD-10-CM | POA: Insufficient documentation

## 2016-04-06 ENCOUNTER — Ambulatory Visit (INDEPENDENT_AMBULATORY_CARE_PROVIDER_SITE_OTHER): Payer: Medicaid Other

## 2016-04-06 ENCOUNTER — Ambulatory Visit (INDEPENDENT_AMBULATORY_CARE_PROVIDER_SITE_OTHER): Payer: Medicaid Other | Admitting: Podiatry

## 2016-04-06 ENCOUNTER — Encounter: Payer: Self-pay | Admitting: Podiatry

## 2016-04-06 VITALS — BP 110/77 | HR 97 | Resp 16 | Ht 66.5 in | Wt 245.0 lb

## 2016-04-06 DIAGNOSIS — M2141 Flat foot [pes planus] (acquired), right foot: Secondary | ICD-10-CM

## 2016-04-06 DIAGNOSIS — M2142 Flat foot [pes planus] (acquired), left foot: Secondary | ICD-10-CM | POA: Diagnosis not present

## 2016-04-06 NOTE — Progress Notes (Signed)
   Subjective:    Patient ID: Nicole Rangel, female    DOB: 1980/11/16, 35 y.o.   MRN: [id number]  HPI: She presents today with chief complaint of painful feet bilateral states that several years ago she heard a tearing sensation in her right foot and since that time her feet a bit hurting her. She states it hurts her to stand for long period of time. She went to her doctor who stated that she had flatfeet. She is also concerned about flaking of her nails.    Review of Systems  Respiratory: Positive for cough.   Musculoskeletal: Positive for myalgias.  Neurological: Positive for headaches.  Psychiatric/Behavioral: The patient is nervous/anxious.   All other systems reviewed and are negative.      Objective:   Physical Exam: Vital signs are stable she is alert and oriented 3 pulses are strongly palpable. Neurologic sensorium is intact present joints to monofilament. Deep tendon reflexes intact. Muscle strength +5 over 5 dorsiflexion plantar flexors and inverters everters onto the musculature is intact. Orthopedic evaluation and since all joints distal to the ankle for range of motion without crepitation. Cutaneous evaluationof well-hydrated cutis no erythema edema saline as drainage or odor. The orthopedic evaluation does demonstrates mild pes planus with negative coalitions her jacks test. Radiographs also do not demonstrate any type of coalition. They do demonstrate mild pes planus. Cutaneous evaluation demonstrates normal healthy-looking nails however they are thin and friable.        Assessment & Plan:  Assessment: Pes planus bilateral. Nail dystrophy bilateral.  Plan: We discussed the need for orthotics. She will follow up with us on an as-needed basis

## 2016-08-01 ENCOUNTER — Encounter (HOSPITAL_COMMUNITY): Payer: Self-pay | Admitting: Emergency Medicine

## 2016-08-01 ENCOUNTER — Emergency Department (HOSPITAL_COMMUNITY)
Admission: EM | Admit: 2016-08-01 | Discharge: 2016-08-02 | Disposition: A | Discharge status: Other Acute Inpatient Hospital | Payer: Medicaid Other | Attending: Emergency Medicine | Admitting: Emergency Medicine

## 2016-08-01 DIAGNOSIS — R45851 Suicidal ideations: Secondary | ICD-10-CM

## 2016-08-01 DIAGNOSIS — T43212A Poisoning by selective serotonin and norepinephrine reuptake inhibitors, intentional self-harm, initial encounter: Secondary | ICD-10-CM | POA: Insufficient documentation

## 2016-08-01 DIAGNOSIS — T50902A Poisoning by unspecified drugs, medicaments and biological substances, intentional self-harm, initial encounter: Secondary | ICD-10-CM

## 2016-08-01 DIAGNOSIS — F411 Generalized anxiety disorder: Secondary | ICD-10-CM | POA: Diagnosis present

## 2016-08-01 DIAGNOSIS — F909 Attention-deficit hyperactivity disorder, unspecified type: Secondary | ICD-10-CM | POA: Insufficient documentation

## 2016-08-01 DIAGNOSIS — F314 Bipolar disorder, current episode depressed, severe, without psychotic features: Secondary | ICD-10-CM

## 2016-08-01 DIAGNOSIS — J45909 Unspecified asthma, uncomplicated: Secondary | ICD-10-CM | POA: Insufficient documentation

## 2016-08-01 DIAGNOSIS — T424X2A Poisoning by benzodiazepines, intentional self-harm, initial encounter: Secondary | ICD-10-CM | POA: Insufficient documentation

## 2016-08-01 LAB — COMPREHENSIVE METABOLIC PANEL
ALT: 13 U/L — ABNORMAL LOW (ref 14–54)
AST: 16 U/L (ref 15–41)
Albumin: 4.1 g/dL (ref 3.5–5.0)
Alkaline Phosphatase: 44 U/L (ref 38–126)
Anion gap: 8 (ref 5–15)
BUN: 9 mg/dL (ref 6–20)
CO2: 23 mmol/L (ref 22–32)
Calcium: 9.4 mg/dL (ref 8.9–10.3)
Chloride: 104 mmol/L (ref 101–111)
Creatinine, Ser: 0.76 mg/dL (ref 0.44–1.00)
GFR calc Af Amer: >60 mL/min (ref >60)
GFR calc non Af Amer: >60 mL/min (ref >60)
Glucose, Bld: 126 mg/dL — ABNORMAL HIGH (ref 65–99)
Potassium: 4 mmol/L (ref 3.5–5.1)
Sodium: 135 mmol/L (ref 135–145)
Total Bilirubin: 0.4 mg/dL (ref 0.3–1.2)
Total Protein: 7.3 g/dL (ref 6.5–8.1)

## 2016-08-01 LAB — CBC
HCT: 44.4 % (ref 36.0–46.0)
Hemoglobin: 14.3 g/dL (ref 12.0–15.0)
MCH: 26.3 pg (ref 26.0–34.0)
MCHC: 32.2 g/dL (ref 30.0–36.0)
MCV: 81.6 fL (ref 78.0–100.0)
Platelets: 348 10*3/uL (ref 150–400)
RBC: 5.44 MIL/uL — ABNORMAL HIGH (ref 3.87–5.11)
RDW: 13.4 % (ref 11.5–15.5)
WBC: 10.2 10*3/uL (ref 4.0–10.5)

## 2016-08-01 LAB — SALICYLATE LEVEL: Salicylate Lvl: <7 mg/dL (ref 2.8–30.0)

## 2016-08-01 LAB — CBG MONITORING, ED: Glucose-Capillary: 100 mg/dL — ABNORMAL HIGH (ref 65–99)

## 2016-08-01 LAB — I-STAT BETA HCG BLOOD, ED (MC, WL, AP ONLY): I-stat hCG, quantitative: <5 m[IU]/mL (ref <5)

## 2016-08-01 LAB — MAGNESIUM: Magnesium: 2.2 mg/dL (ref 1.7–2.4)

## 2016-08-01 LAB — ETHANOL: Alcohol, Ethyl (B): <5 mg/dL (ref <5)

## 2016-08-01 LAB — ACETAMINOPHEN LEVEL: Acetaminophen (Tylenol), Serum: <10 ug/mL — ABNORMAL LOW (ref 10–30)

## 2016-08-01 MED ORDER — SODIUM CHLORIDE 0.9 % IV BOLUS (SEPSIS)
2000.0000 mL | Freq: Once | INTRAVENOUS | Status: AC
  Administered 2016-08-01: 2000 mL via INTRAVENOUS

## 2016-08-01 MED ORDER — SODIUM CHLORIDE 0.9 % IV BOLUS (SEPSIS)
1000.0000 mL | Freq: Once | INTRAVENOUS | Status: AC
  Administered 2016-08-01: 1000 mL via INTRAVENOUS

## 2016-08-01 NOTE — ED Triage Notes (Signed)
Patient was trying to kill herself because she said she is tired, tired of struggling, and tired of fighting. Patient took 5 xanax's, 4 trazodone, and drunk some vodka.

## 2016-08-01 NOTE — ED Notes (Signed)
Poison control recommended... Supportive care 6 hours obs EKG, electrolyte level, hydration Monitor for seizures

## 2016-08-01 NOTE — ED Notes (Signed)
Attempted access x 2.

## 2016-08-01 NOTE — ED Provider Notes (Signed)
[facility]-EMERGENCY DEPT Provider Note   CSN: [id number] Arrival date & time: 08/01/16  2105 By signing my name below, I, Bridgette Habermann, attest that this documentation has been prepared under the direction and in the presence of Raeford Razor, MD. Electronically Signed: Bridgette Habermann, ED Scribe. 08/01/16. 9:31 PM.  History   Chief Complaint No chief complaint on file.  HPI Comments: Nicole Rangel is a 35 y.o. female with h/o depression, bipolar 1 disorder, and panic disorder who presents to the Emergency Department for a suicide attempt that occurred just PTA. Pt's brother found her. Pt reports taking 4-5 1mg  Xanax, 4-5 100mg  Trazodoone. She also reports drinking an ounce vodka. Pt notes she has tried to commit suicide in the past. She states she just lives a "bad life" and is tired of struggling. Pt further reports that she cannot find a job and cannot take care of herself. Pt states she feels "tired" at this time. No known fevers.  The history is provided by the patient. No language interpreter was used.    Past Medical History:  Diagnosis Date  . ADHD (attention deficit hyperactivity disorder)   . Allergy   . Anxiety   . Asthma   . Bipolar 1 disorder (HCC)   . Depression   . Headache   . Mood disorder (HCC)   . Panic disorder     Patient Active Problem List   Diagnosis Date Noted  . Morbid (severe) obesity due to excess calories (HCC) 03/19/2016  . Borderline diabetes mellitus 03/19/2016  . Acquired hypothyroidism 12/28/2015  . Headache(784.0) 03/23/2014  . Cephalalgia 03/23/2014  . Adult ADHD 03/11/2013  . Generalized anxiety disorder 03/11/2013  . Dyslipidemia 03/11/2013  . Bipolar disorder, unspecified 03/11/2013  . Urinary incontinence 03/11/2013  . Adult attention deficit disorder 03/11/2013  . Bipolar affective disorder (HCC) 03/11/2013    No past surgical history on file.  OB History    No data available       Home Medications    Prior to Admission medications    Medication Sig Start Date End Date Taking? Authorizing Provider  cetirizine (ZYRTEC) 10 MG tablet Take 10 mg by mouth daily. 01/17/16   Historical Provider, MD  cholecalciferol (VITAMIN D) 1000 UNITS tablet Take 1,000 Units by mouth daily.    Historical Provider, MD  Cyclobenzaprine HCl (FLEXERIL PO) Take by mouth.    Historical Provider, MD  ferrous sulfate 325 (65 FE) MG tablet Take by mouth.    Historical Provider, MD  fluticasone (FLONASE) 50 MCG/ACT nasal spray PLACE 1 SPRAY IN EACH NOSTRIL DAILY AS DIRECTED 01/17/16   Historical Provider, MD  KRILL OIL PO Take by mouth.    Historical Provider, MD  levothyroxine (SYNTHROID, LEVOTHROID) 100 MCG tablet TAKE 1 TABLET (100 MCG TOTAL) BY MOUTH DAILY AT 0600. 03/01/16   Historical Provider, MD  lisdexamfetamine (VYVANSE) 40 MG capsule Take 40 mg by mouth.    Historical Provider, MD  METADATE CD 30 MG CR capsule TAKE 1 CAPSULE BY MOUTH IN THE MORNING BEFORE BREAKFAST AND 1 CAPSULE IN EVENING BEFORE DINNER 01/13/16   Historical Provider, MD  Multiple Vitamin (MULTIVITAMIN) capsule Take 1 capsule by mouth daily.    Historical Provider, MD  sertraline (ZOLOFT) 100 MG tablet Take 200 mg by mouth daily.     Historical Provider, MD  SPRINTEC 28 0.25-35 MG-MCG tablet TAKE 1 TABLET(S) EVERY DAY BY ORAL ROUTE. 06/11/15   Historical Provider, MD  traZODone (DESYREL) 100 MG tablet  12/14/15  Historical Provider, MD  ziprasidone (GEODON) 20 MG capsule Take 20 mg by mouth.    Historical Provider, MD    Family History Family History  Problem Relation Age of Onset  . Diabetes Mother     Social History Social History  Substance Use Topics  . Smoking status: Never Smoker  . Smokeless tobacco: Never Used  . Alcohol use 0.0 oz/week     Comment: occasional     Allergies   Lidocaine   Review of Systems Review of Systems  Constitutional: Negative for fever.  Psychiatric/Behavioral: Positive for suicidal ideas.  All other systems reviewed and are  negative.    Physical Exam Updated Vital Signs BP 95/72 (BP Location: Right Arm)   Pulse 116   Temp 98.5 F (36.9 C) (Oral)   Resp 20   SpO2 97%   Physical Exam  Constitutional: She is oriented to person, place, and time. She appears well-developed and well-nourished. No distress.  HENT:  Head: Normocephalic and atraumatic.  Nose: Nose normal.  Mouth/Throat: Oropharynx is clear and moist. No oropharyngeal exudate.  Eyes: Conjunctivae and EOM are normal. Pupils are equal, round, and reactive to light. No scleral icterus.  Neck: Normal range of motion. Neck supple. No JVD present. No tracheal deviation present. No thyromegaly present.  Cardiovascular: Normal rate, regular rhythm and normal heart sounds.  Exam reveals no gallop and no friction rub.   No murmur heard. Pulmonary/Chest: Effort normal and breath sounds normal. No respiratory distress. She has no wheezes. She exhibits no tenderness.  Abdominal: Soft. Bowel sounds are normal. She exhibits no distension and no mass. There is no tenderness. There is no rebound and no guarding.  Musculoskeletal: Normal range of motion. She exhibits no edema or tenderness.  Lymphadenopathy:    She has no cervical adenopathy.  Neurological: She is alert and oriented to person, place, and time. No cranial nerve deficit. She exhibits normal muscle tone.  Skin: Skin is warm and dry. No rash noted. No erythema. No pallor.  Psychiatric:  Drowsy, opens eyes to voice. Answers questions appropriately and follows simple commands. No focal motor deficit.  Nursing note and vitals reviewed.    ED Treatments / Results  DIAGNOSTIC STUDIES: Oxygen Saturation is 97% on RA, adequate by my interpretation.    COORDINATION OF CARE: 9:28 PM Discussed treatment plan with pt at bedside and pt agreed to plan.  Labs (all labs ordered are listed, but only abnormal results are displayed) Labs Reviewed - No data to display  EKG  EKG  Interpretation  Date/Time:  Tuesday August 01 2016 21:17:50 EDT Ventricular Rate:  98 PR Interval:    QRS Duration: 67 QT Interval:  415 QTC Calculation: 530 R Axis:   2 Text Interpretation:  Sinus rhythm LVH with secondary repolarization abnormality Prolonged QT interval No old tracing to compare Confirmed by Amalea Ottey  MD, Lucendia Leard [phone]([id number]) on 08/01/2016 10:02:29 PM       Radiology No results found.  Procedures Procedures (including critical care time)  Medications Ordered in ED Medications - No data to display   Initial Impression / Assessment and Plan / ED Course  I have reviewed the triage vital signs and the nursing notes.  Pertinent labs & imaging results that were available during my care of the patient were reviewed by me and considered in my medical decision making (see chart for details).  Clinical Course    34yF with intentional drug overdose. Poison control recommending 6 hour observation. Initial EKG with prolonged  QTc but subsequent EKG normalized. One soft BP which responded to IVF. She is drowsy but following commands and answering questions appropriately.  Final Clinical Impressions(s) / ED Diagnoses   Final diagnoses:  Intentional drug overdose, initial encounter [facility])    New Prescriptions New Prescriptions   No medications on file   I personally preformed the services scribed in my presence. The recorded information has been reviewed is accurate. Raeford Razor, MD.     Raeford Razor, MD 08/01/16 [phone]

## 2016-08-02 ENCOUNTER — Encounter (HOSPITAL_COMMUNITY): Payer: Self-pay | Admitting: *Deleted

## 2016-08-02 ENCOUNTER — Encounter (HOSPITAL_COMMUNITY): Payer: Self-pay

## 2016-08-02 ENCOUNTER — Inpatient Hospital Stay (HOSPITAL_COMMUNITY)
Admission: AD | Admit: 2016-08-02 | Discharge: 2016-08-08 | DRG: 885 | Disposition: A | Discharge status: Home / Self Care | Payer: Federal, State, Local not specified - Other | Attending: Psychiatry | Admitting: Psychiatry

## 2016-08-02 DIAGNOSIS — R45851 Suicidal ideations: Secondary | ICD-10-CM | POA: Diagnosis present

## 2016-08-02 DIAGNOSIS — F3132 Bipolar disorder, current episode depressed, moderate: Secondary | ICD-10-CM | POA: Diagnosis not present

## 2016-08-02 DIAGNOSIS — F319 Bipolar disorder, unspecified: Secondary | ICD-10-CM | POA: Diagnosis present

## 2016-08-02 DIAGNOSIS — Z833 Family history of diabetes mellitus: Secondary | ICD-10-CM | POA: Diagnosis not present

## 2016-08-02 DIAGNOSIS — Z915 Personal history of self-harm: Secondary | ICD-10-CM | POA: Diagnosis not present

## 2016-08-02 DIAGNOSIS — E039 Hypothyroidism, unspecified: Secondary | ICD-10-CM | POA: Diagnosis present

## 2016-08-02 DIAGNOSIS — Z888 Allergy status to other drugs, medicaments and biological substances status: Secondary | ICD-10-CM | POA: Diagnosis not present

## 2016-08-02 DIAGNOSIS — G4700 Insomnia, unspecified: Secondary | ICD-10-CM | POA: Diagnosis present

## 2016-08-02 DIAGNOSIS — F314 Bipolar disorder, current episode depressed, severe, without psychotic features: Secondary | ICD-10-CM | POA: Diagnosis not present

## 2016-08-02 DIAGNOSIS — Z79899 Other long term (current) drug therapy: Secondary | ICD-10-CM | POA: Diagnosis not present

## 2016-08-02 LAB — RAPID URINE DRUG SCREEN, HOSP PERFORMED
Amphetamines: NOT DETECTED
Barbiturates: NOT DETECTED
Benzodiazepines: POSITIVE — AB
Cocaine: NOT DETECTED
Opiates: NOT DETECTED
Tetrahydrocannabinol: NOT DETECTED

## 2016-08-02 LAB — URINALYSIS, ROUTINE W REFLEX MICROSCOPIC
Bilirubin Urine: NEGATIVE
Glucose, UA: NEGATIVE mg/dL
Hgb urine dipstick: NEGATIVE
Ketones, ur: NEGATIVE mg/dL
Leukocytes, UA: NEGATIVE
Nitrite: NEGATIVE
Protein, ur: NEGATIVE mg/dL
Specific Gravity, Urine: 1.005 (ref 1.005–1.030)
pH: 6.5 (ref 5.0–8.0)

## 2016-08-02 MED ORDER — TRAZODONE HCL 100 MG PO TABS
100.0000 mg | ORAL_TABLET | Freq: Every day | ORAL | Status: DC
  Administered 2016-08-02: 100 mg via ORAL
  Filled 2016-08-02 (×3): qty 1

## 2016-08-02 MED ORDER — NICOTINE 21 MG/24HR TD PT24
21.0000 mg | MEDICATED_PATCH | Freq: Every day | TRANSDERMAL | Status: DC
  Filled 2016-08-02 (×8): qty 1

## 2016-08-02 MED ORDER — IBUPROFEN 200 MG PO TABS: 600.0000 mg | ORAL_TABLET | Freq: Three times a day (TID) | ORAL | Status: DC | PRN

## 2016-08-02 MED ORDER — ALUM & MAG HYDROXIDE-SIMETH 200-200-20 MG/5ML PO SUSP: 30.0000 mL | ORAL | Status: DC | PRN

## 2016-08-02 MED ORDER — TRAZODONE HCL 100 MG PO TABS: 100.0000 mg | ORAL_TABLET | Freq: Every day | ORAL | Status: DC

## 2016-08-02 MED ORDER — IBUPROFEN 600 MG PO TABS
600.0000 mg | ORAL_TABLET | Freq: Three times a day (TID) | ORAL | Status: DC | PRN
  Administered 2016-08-03 – 2016-08-06 (×3): 600 mg via ORAL
  Filled 2016-08-02 (×3): qty 1

## 2016-08-02 MED ORDER — VITAMIN D3 25 MCG (1000 UNIT) PO TABS
1000.0000 [IU] | ORAL_TABLET | Freq: Every day | ORAL | Status: DC
  Filled 2016-08-02: qty 1

## 2016-08-02 MED ORDER — LEVOTHYROXINE SODIUM 100 MCG PO TABS
100.0000 ug | ORAL_TABLET | Freq: Every day | ORAL | Status: DC
  Filled 2016-08-02: qty 1

## 2016-08-02 MED ORDER — VITAMIN D3 25 MCG (1000 UNIT) PO TABS
1000.0000 [IU] | ORAL_TABLET | Freq: Every day | ORAL | Status: DC
  Administered 2016-08-03 – 2016-08-08 (×6): 1000 [IU] via ORAL
  Filled 2016-08-02 (×8): qty 1

## 2016-08-02 MED ORDER — MAGNESIUM HYDROXIDE 400 MG/5ML PO SUSP: 30.0000 mL | Freq: Every day | ORAL | Status: DC | PRN

## 2016-08-02 MED ORDER — OXCARBAZEPINE 300 MG PO TABS
300.0000 mg | ORAL_TABLET | Freq: Two times a day (BID) | ORAL | Status: DC
  Filled 2016-08-02 (×6): qty 1

## 2016-08-02 MED ORDER — ONDANSETRON HCL 4 MG PO TABS: 4.0000 mg | ORAL_TABLET | Freq: Three times a day (TID) | ORAL | Status: DC | PRN

## 2016-08-02 MED ORDER — LEVOTHYROXINE SODIUM 100 MCG PO TABS
100.0000 ug | ORAL_TABLET | Freq: Every day | ORAL | Status: DC
  Administered 2016-08-03 – 2016-08-08 (×6): 100 ug via ORAL
  Filled 2016-08-02 (×9): qty 1

## 2016-08-02 MED ORDER — ACETAMINOPHEN 325 MG PO TABS: 650.0000 mg | ORAL_TABLET | ORAL | Status: DC | PRN

## 2016-08-02 MED ORDER — NICOTINE 21 MG/24HR TD PT24: 21.0000 mg | MEDICATED_PATCH | Freq: Every day | TRANSDERMAL | Status: DC

## 2016-08-02 MED ORDER — ZIPRASIDONE HCL 20 MG PO CAPS: 40.0000 mg | ORAL_CAPSULE | Freq: Every day | ORAL | Status: DC

## 2016-08-02 MED ORDER — OXCARBAZEPINE 300 MG PO TABS
300.0000 mg | ORAL_TABLET | Freq: Two times a day (BID) | ORAL | Status: DC
Start: 2016-08-02 — End: 2016-08-02

## 2016-08-02 MED ORDER — ZIPRASIDONE HCL 40 MG PO CAPS
40.0000 mg | ORAL_CAPSULE | Freq: Every day | ORAL | Status: DC
  Filled 2016-08-02 (×3): qty 1

## 2016-08-02 MED ORDER — INFLUENZA VAC SPLIT QUAD 0.5 ML IM SUSY
0.5000 mL | PREFILLED_SYRINGE | INTRAMUSCULAR | Status: AC
  Administered 2016-08-03: 0.5 mL via INTRAMUSCULAR
  Filled 2016-08-02: qty 0.5

## 2016-08-02 NOTE — Tx Team (Signed)
Initial Treatment Plan 08/02/2016 3:51 PM Netta Neatlexisa L Butsch [id number]    PATIENT STRESSORS: Financial difficulties Occupational concerns   PATIENT STRENGTHS: Capable of independent living Wellsite geologistCommunication skills General fund of knowledge Motivation for treatment/growth   PATIENT IDENTIFIED PROBLEMS: Depression  Anxiety  Suicidal ideation  "I just want to go home"  "I have no clue, I wasn't given much of a choice"              DISCHARGE CRITERIA:  Improved stabilization in mood, thinking, and/or behavior Verbal commitment to aftercare and medication compliance  PRELIMINARY DISCHARGE PLAN: Outpatient therapy Medication management  PATIENT/FAMILY INVOLVEMENT: This treatment plan has been presented to and reviewed with the patient, Netta NeatAlexisa L Strick.  The patient and family have been given the opportunity to ask questions and make suggestions.  Levin BaconHeather V Tynesha Free, RN 08/02/2016, 3:51 PM

## 2016-08-02 NOTE — Progress Notes (Signed)
D: Pt was in bed in her room upon initial approach.  Pt presents with appropriate affect and depressed mood.  Pt reports having a good visit with a friend, daughter, and daughter's boyfriend tonight.  Her goal is to "read and sleep" tonight.  Pt denies SI/HI, denies hallucinations, denies pain.  Pt has stayed in her room for the majority of the night and she did not attend evening group. A: Introduced self to pt.  Actively listened to pt and offered support and encouragement. Medications administered per order.   R: Pt is safe on the unit.  Pt is compliant with medications.  Pt verbally contracts for safety.  Will continue to monitor and assess.

## 2016-08-02 NOTE — ED Notes (Signed)
Pt was oriented to room and unit.  Pt denies S/I saying she was trying to get some sleep yesterday.  I ensured patient of her safety.  15 minute checks and video monitoring in place.

## 2016-08-02 NOTE — ED Provider Notes (Signed)
By signing my name below, I, Emmanuella Mensah, attest that this documentation has been prepared under the direction and in the presence of Devoria AlbeIva Gurtha Picker, MD. Electronically Signed: Angelene GiovanniEmmanuella Mensah, ED Scribe. 08/02/16. 3:36 AM.   3:13 AM - Re-evaluation at shift change.   HPI Comments: Nicole Rangel is a 35 y.o. female with a hx of depression, bipolar 1 disorder, and panic disorder who presents to the Emergency Department for evaluation s/p intentional overdose on 5 Xanax and 4 Trazodone while drinking ETOH. Pt explains that she took those medications because she "does not want to live anymore". No other complaints at this time.   3:16 AM- Pt was easily awaken and so will place TTS consult.    Diagnoses that have been ruled out:  None  Diagnoses that are still under consideration:  None  Final diagnoses:  Intentional drug overdose, initial encounter (HCC)  Suicidal ideation    Disposition pending  Devoria AlbeIva Seher Schlagel, MD, FACEP    I personally performed the services described in this documentation, which was scribed in my presence. The recorded information has been reviewed and considered.  Devoria AlbeIva Mellody Masri, MD, Concha PyoFACEP    Kamori Barbier, MD 08/02/16 [phone]

## 2016-08-02 NOTE — ED Notes (Signed)
Onalee HuaDavid called from poison control

## 2016-08-02 NOTE — BH Assessment (Addendum)
Tele Assessment Note   Nicole Rangel is a 35 y.o. single female who presented to [facility] ED by her brother.  Pt lives at home with her daughter. Pt reports feeling suicidal and researched different "methods" to kill herself tonight. Pt states that google was not helpful in her attempts. Pt could not identify any specific triggers that caused overdose tonight.. Pt was unable to contract for safety or confirm that no weapons were in the home.  Pt states she had 2 suicide attempts in the past of trying to cut her wrist. Pt reports she scratches her legs with her fingernails constantly. Pt currently denies any HI. Pt states she often will see images of her cat out of the corner of her eye but when she looks, the image will disappear.  Pt denies having any auditory hallucinations. Pt is currently not in engaged in outpatient services but was going to [facility] and seeing therapist, Leroy Libman at the end of 06/2016.  Pt reports she does have a psychiatrist, Crystal that has prescribed her Zoloft, Geodon, Xanax, and Trazodone. Pt reports taking medications as prescribed. Pt denies any family history of SI/HI. and SA treatment. Pt denies substance abuse history but does drink alcohol on occasions.   Pt reported feeling the following symptoms: tearful, isolating, lost in interest in things that were pleasurable, having mood swings and feeling irritable, and having difficulty concentrating.  Pt states her sleeping and eating habits have decreased with her getting no more than 3 hours of sleep a night.  Pt states she has lost weight within the past few months. Pt does not need any assistance with ADLs but was surprised when Pt has one family support, her brother. Pt states the rest of her family does not "fool with her."   Pt was dressed in hospital gown during assessment. Pt was sleepy and drowsy but was able to answer questions. Pt's mood and affect were depressed. Pt was attentive and cooperative  with clinician. Pt stated she was not interested in being admitted into an inpatient facility and would not volunteer to sign the form.   Diagnosis: Bipolar I Major Depressive Disorder, Recurrent, Severe   Past Medical History:  Past Medical History:  Diagnosis Date  . ADHD (attention deficit hyperactivity disorder)   . Allergy   . Anxiety   . Asthma   . Bipolar 1 disorder (HCC)   . Depression   . Headache   . Mood disorder (HCC)   . Panic disorder     History reviewed. No pertinent surgical history.  Family History:  Family History  Problem Relation Age of Onset  . Diabetes Mother     Social History:  reports that she has never smoked. She has never used smokeless tobacco. She reports that she drinks alcohol. She reports that she does not use drugs.  Additional Social History:  Alcohol / Drug Use Pain Medications: See MAR Prescriptions: See MAR  Over the Counter: See MAR Longest period of sobriety (when/how long): na  CIWA: CIWA-Ar BP: 118/75 Pulse Rate: 80 COWS:    PATIENT STRENGTHS: (choose at least two) Average or above average intelligence Capable of independent living Communication skills General fund of knowledge Motivation for treatment/growth Physical Health  Allergies:  Allergies  Allergen Reactions  . Lidocaine Swelling    To face.    Home Medications:  (Not in a hospital admission)  OB/GYN Status:  No LMP recorded.  General Assessment Data [facility] of Assessment: [facility]  Assessment: In system Is this a Tele or Face-to-Face Assessment?: Face-to-Face Is this an Initial Assessment or a Re-assessment for this encounter?: Initial Assessment Marital status: Single Maiden name: na Is patient pregnant?: No Pregnancy Status: No Living Arrangements: Children (lives with daughter) Can pt return to current living arrangement?: No (pt cannont contract for safety) Admission Status: Voluntary Is patient capable of signing voluntary admission?: No  (pt states she does not want to be admitted into hospital) Insurance type: Medicaid  Medical Screening Exam [facility] Walk-in ONLY) Medical Exam completed: Yes  Crisis Care Plan Living Arrangements: Children (lives with daughter) Legal Guardian: Other: (self) Name of Psychiatrist: Crystal Name of Therapist: [facility]   Education Status Is patient currently in school?: No Current Grade: na Highest grade of school patient has completed: bacehlor degree Name of school: na Contact person: na  Risk to self with the past 6 months Suicidal Ideation: Yes-Currently Present Has patient been a risk to self within the past 6 months prior to admission? : Yes Suicidal Intent: Yes-Currently Present Has patient had any suicidal intent within the past 6 months prior to admission? : Yes Is patient at risk for suicide?: Yes Suicidal Plan?:  (pt overdosed on multiple pills today) Has patient had any suicidal plan within the past 6 months prior to admission? : No Access to Means: Yes (medications ) Specify Access to Suicidal Means:  (medications, knifes) What has been your use of drugs/alcohol within the last 12 months?: na Previous Attempts/Gestures: Yes How many times?: 2 Other Self Harm Risks: cutting  Triggers for Past Attempts: Unknown Intentional Self Injurious Behavior: Cutting Family Suicide History: No Recent stressful life event(s): Other (Comment) (pt did not specify specific stressors) Persecutory voices/beliefs?: No Depression: Yes Depression Symptoms: Insomnia, Tearfulness, Isolating, Fatigue, Guilt, Loss of interest in usual pleasures, Feeling worthless/self pity, Feeling angry/irritable Substance abuse history and/or treatment for substance abuse?: No Suicide prevention information given to non-admitted patients: Not applicable  Risk to Others within the past 6 months Homicidal Ideation: No Does patient have any lifetime risk of violence toward others beyond  the six months prior to admission? : No Thoughts of Harm to Others: No Current Homicidal Intent: No Current Homicidal Plan: No Access to Homicidal Means: No Identified Victim: na History of harm to others?: No Assessment of Violence: None Noted Violent Behavior Description: na Does patient have access to weapons?: No Criminal Charges Pending?: No Does patient have a court date: No Is patient on probation?: No  Psychosis Hallucinations: Visual (pt reports sometimes seeing image of her cat ) Delusions: None noted  Mental Status Report Appearance/Hygiene: In hospital gown, Disheveled Eye Contact: Poor Motor Activity: Unremarkable Speech: Slow, Soft, Incoherent Level of Consciousness: Sleeping, Drowsy Mood: Depressed Affect: Appropriate to circumstance, Depressed Anxiety Level: Minimal Thought Processes: Coherent Judgement: Impaired Orientation: Person, Place, Time, Situation  Cognitive Functioning Appetite: Poor Weight Loss: 0 Weight Gain: 0 Sleep: Decreased Total Hours of Sleep: 3 Vegetative Symptoms: None  ADLScreening [facility] Assessment Services) Patient's cognitive ability adequate to safely complete daily activities?: Yes Patient able to express need for assistance with ADLs?: Yes Independently performs ADLs?: Yes (appropriate for developmental age)  Prior Inpatient Therapy Prior Inpatient Therapy:  (unknown) Prior Therapy Dates: na Prior Therapy Facilty/Provider(s): na Reason for Treatment: depresion  Prior Outpatient Therapy Prior Outpatient Therapy: Yes Prior Therapy Dates: 2017 Prior Therapy Facilty/Provider(s): [facility] Reason for Treatment: depression Does patient have an ACCT team?: No Does patient have Intensive In-House Services?  :  No Does patient have [facility] services? : No Does patient have P4CC services?: No  ADL Screening (condition at time of admission) Patient's cognitive ability adequate to safely complete daily  activities?: Yes Is the patient deaf or have difficulty hearing?: No Does the patient have difficulty seeing, even when wearing glasses/contacts?: No Does the patient have difficulty concentrating, remembering, or making decisions?: No Patient able to express need for assistance with ADLs?: Yes Does the patient have difficulty dressing or bathing?: No Independently performs ADLs?: Yes (appropriate for developmental age) Does the patient have difficulty walking or climbing stairs?: No Weakness of Legs: None Weakness of Arms/Hands: Left (pt reports damanging rotator cuff )  Home Assistive Devices/Equipment Home Assistive Devices/Equipment: None    Abuse/Neglect Assessment (Assessment to be complete while patient is alone) Physical Abuse: Denies Verbal Abuse: Denies Sexual Abuse: Denies Exploitation of patient/patient's resources: Denies Self-Neglect: Denies     Merchant navy officerAdvance Directives (For Healthcare) Does patient have an advance directive?: No Would patient like information on creating an advanced directive?: No - patient declined information    Additional Information 1:1 In Past 12 Months?: No CIRT Risk: No Elopement Risk: No Does patient have medical clearance?: Yes     Disposition: Gave clinical report Donell SievertSpencer Simon, PA who states pt meets inpatient criteria. Clint Bolderori Beck, [facility] LPC confirmed no bed availability right now but [facility] today.  TTS will continue to seek placement.  Notified Victorino DikeJennifer, RN of decision.   Orlie Pollenshley Djimon Lundstrom, [facility], [facility] 08/02/2016 5:29 AM      Evlyn CourierAshley n Derrion Tritz 08/02/2016 5:13 AM

## 2016-08-02 NOTE — ED Notes (Signed)
Patient's brother, Concepcion LivingDavid Caldwell, would like to be called when patient leaves.  [phone].

## 2016-08-02 NOTE — Plan of Care (Signed)
Problem: Medication: Goal: Compliance with prescribed medication regimen will improve Outcome: Progressing Pt is compliant with scheduled medication tonight.

## 2016-08-02 NOTE — ED Notes (Signed)
Pt meets criteria for inpatient [facility]

## 2016-08-02 NOTE — ED Notes (Signed)
Pt discharged ambulatory with [employer].  Pt was calm and cooperative.  All belongings were sent with pt. 

## 2016-08-02 NOTE — Progress Notes (Signed)
Nicole Rangel is a 35 year old female being admitted involuntarily to [facility] from [facility].  She came to the ED accompanied by her brother after attempted OD on Trazodone and Xanax.  She has 2 suicide attempts in the past.  Denied A/V hallucinations or A/V hallucinations.  She reported that she was unable to contract for safety.  She is not currently seeing an OP provider for therapy.  She reported that she does see a psychiatrist and takes her medications as prescribed.  She reported feeling tearful, isolating, anhedonia, mood swings, irritability and trouble concentrating.  She denies current SI/HI or A/V hallucinations.  She is diagnosed with Bipolar I and Major Depressive Disorder, recurrent, severe.  She denies any pain or discomfort and appears to be in no physical distress.  Oriented her to the unit.  Admission paperwork completed and signed.  Belongings searched and secured in locker #  42.  Skin assessment completed and no skin issues noted.  Q 15 minute checks initiated for safety.  We will monitor the progress towards her goals.

## 2016-08-02 NOTE — ED Notes (Signed)
David from [employer] called to get lab values.

## 2016-08-02 NOTE — Progress Notes (Signed)
Nicole Rangel politely declined 1800 Trileptal, as she had never taken the medication before and wanted to meet with a provider before starting a new medication.

## 2016-08-02 NOTE — ED Notes (Signed)
Patient awakened easily at hearing her name.  [facility] after being wanded by security a second time.  Patient oriented to her room.  She states she is not suicidal, she is stressed out.

## 2016-08-02 NOTE — BH Assessment (Signed)
[facility] Assessment Progress Note  Per Thedore MinsMojeed Akintayo, MD, this pt requires psychiatric hospitalization.  Pt also meets criteria for IVC, which he has initiated.  [place] [facility] [place], and at 11:23 Ok EdwardsMagistrate Antonelli confirms receipt.  Findings and Custody Order has been served.  Berneice Heinrichina Tate, RN, [facility].  Pt's nurse, Kendal Hymendie, has been notified, and agrees to call report to [phone].  Pt is to be transported via [employer].   Doylene Canninghomas Donavon Kimrey, MA Triage Specialist [phone]

## 2016-08-03 DIAGNOSIS — Z888 Allergy status to other drugs, medicaments and biological substances status: Secondary | ICD-10-CM

## 2016-08-03 DIAGNOSIS — F314 Bipolar disorder, current episode depressed, severe, without psychotic features: Principal | ICD-10-CM

## 2016-08-03 MED ORDER — ZIPRASIDONE HCL 40 MG PO CAPS
40.0000 mg | ORAL_CAPSULE | Freq: Every day | ORAL | Status: DC
  Filled 2016-08-03: qty 1

## 2016-08-03 MED ORDER — TRAZODONE HCL 100 MG PO TABS
200.0000 mg | ORAL_TABLET | Freq: Every day | ORAL | Status: DC
  Administered 2016-08-03 – 2016-08-06 (×4): 200 mg via ORAL
  Filled 2016-08-03 (×8): qty 2

## 2016-08-03 MED ORDER — ZIPRASIDONE HCL 40 MG PO CAPS
40.0000 mg | ORAL_CAPSULE | Freq: Every day | ORAL | Status: DC
  Administered 2016-08-03 – 2016-08-04 (×2): 40 mg via ORAL
  Filled 2016-08-03 (×4): qty 1

## 2016-08-03 NOTE — [facility] Suicide Risk Assessment (Signed)
[facility] INPATIENT:  Family/Significant Other Suicide Prevention Education  Suicide Prevention Education:  Patient Refusal for Family/Significant Other Suicide Prevention Education: The patient Nicole Rangel has refused to provide written consent for family/significant other to be provided Family/Significant Other Suicide Prevention Education during admission and/or prior to discharge.  Physician notified.  Sallee Langenne C Cunningham 08/03/2016, 3:15 PM

## 2016-08-03 NOTE — [facility] Group Notes (Signed)
Type of Therapy: Mental Health Association Presentation  Participation Level: Active  Participation Quality: Attentive  Affect: Appropriate  Cognitive: Oriented  Insight: Developing/Improving  Engagement in Therapy: Engaged  Modes of Intervention: Discussion, Education and Socialization  Summary of Progress/Problems: Mental Health Association (MHA) Speaker came to talk about his personal journey with substance abuse and addiction. The pt processed ways by which to relate to the speaker. MHA speaker provided handouts and educational information pertaining to groups and services offered by the MHA. Pt was engaged in speaker's presentation and was receptive to resources provided.   

## 2016-08-03 NOTE — Tx Team (Signed)
Interdisciplinary Treatment and Diagnostic Plan Update  08/03/2016 Time of Session: 9:30 AM Nicole Rangel MRN: [id number]  Principal Diagnosis: Bipolar 1 Disorder  Secondary Diagnoses: Active Problems:   Bipolar affective disorder, current episode depressed (HCC)   Current Medications:  Current Facility-Administered Medications  Medication Dose Route Frequency Provider Last Rate Last Dose  . acetaminophen (TYLENOL) tablet 650 mg  650 mg Oral Q4H PRN Patrecia Pour, NP      . alum & mag hydroxide-simeth (MAALOX/MYLANTA) 200-200-20 MG/5ML suspension 30 mL  30 mL Oral PRN Patrecia Pour, NP      . cholecalciferol (VITAMIN D) tablet 1,000 Units  1,000 Units Oral Daily Patrecia Pour, NP   1,000 Units at 08/03/16 0813  . ibuprofen (ADVIL,MOTRIN) tablet 600 mg  600 mg Oral Q8H PRN Patrecia Pour, NP      . levothyroxine (SYNTHROID, LEVOTHROID) tablet 100 mcg  100 mcg Oral QAC breakfast Patrecia Pour, NP   100 mcg at 08/03/16 0610  . magnesium hydroxide (MILK OF MAGNESIA) suspension 30 mL  30 mL Oral Daily PRN Patrecia Pour, NP      . nicotine (NICODERM CQ - dosed in mg/24 hours) patch 21 mg  21 mg Transdermal Daily Patrecia Pour, NP      . ondansetron [facility]) tablet 4 mg  4 mg Oral Q8H PRN Patrecia Pour, NP      . traZODone (DESYREL) tablet 200 mg  200 mg Oral QHS Sueanne Margarita, MD      . ziprasidone (GEODON) capsule 40 mg  40 mg Oral Daily Patrecia Pour, NP       PTA Medications: Prescriptions Prior to Admission  Medication Sig Dispense Refill Last Dose  . cholecalciferol (VITAMIN D) 1000 UNITS tablet Take 1,000 Units by mouth daily.   07/31/2016 at Unknown time  . ferrous sulfate 325 (65 FE) MG tablet Take by mouth.   07/31/2016 at Unknown time  . ibuprofen (ADVIL,MOTRIN) 200 MG tablet Take 800 mg by mouth every 6 (six) hours as needed for moderate pain.   08/01/2016 at Unknown time  . levothyroxine (SYNTHROID, LEVOTHROID) 100 MCG tablet Take 1 tablet by mouth daily.    08/01/2016 at Unknown time  . phentermine (ADIPEX-P) 37.5 MG tablet Take 1 tablet by mouth daily.  1 08/01/2016 at Unknown time  . sertraline (ZOLOFT) 100 MG tablet Take 200 mg by mouth daily.    08/01/2016 at Unknown time  . traZODone (DESYREL) 100 MG tablet Take 100 mg by mouth at bedtime.    08/01/2016 at Unknown time  . ziprasidone (GEODON) 20 MG capsule Take 40 mg by mouth daily.    07/31/2016 at Unknown time    Patient Stressors: Financial difficulties Occupational concerns  Patient Strengths: Capable of independent living Curator fund of knowledge Motivation for treatment/growth  Treatment Modalities: Medication Management, Group therapy, Case management,  1 to 1 session with clinician, Psychoeducation, Recreational therapy.   Physician Treatment Plan for Primary Diagnosis: <principal problem not specified> Long Term Goal(s): Improvement in symptoms so as ready for discharge Improvement in symptoms so as ready for discharge   Short Term Goals: Ability to identify changes in lifestyle to reduce recurrence of condition will improve Ability to verbalize feelings will improve Ability to disclose and discuss suicidal ideas Ability to demonstrate self-control will improve Ability to identify and develop effective coping behaviors will improve Ability to maintain clinical measurements within normal limits will improve Compliance with prescribed medications  will improve Ability to identify triggers associated with substance abuse/mental health issues will improve Ability to identify changes in lifestyle to reduce recurrence of condition will improve Ability to verbalize feelings will improve Ability to disclose and discuss suicidal ideas Ability to demonstrate self-control will improve Ability to identify and develop effective coping behaviors will improve Ability to maintain clinical measurements within normal limits will improve Compliance with prescribed  medications will improve Ability to identify triggers associated with substance abuse/mental health issues will improve  Medication Management: Evaluate patient's response, side effects, and tolerance of medication regimen.  Therapeutic Interventions: 1 to 1 sessions, Unit Group sessions and Medication administration.  Evaluation of Outcomes: Not Met  Physician Treatment Plan for Secondary Diagnosis: Active Problems:   Bipolar affective disorder, current episode depressed ([place])  Long Term Goal(s): Improvement in symptoms so as ready for discharge Improvement in symptoms so as ready for discharge   Short Term Goals: Ability to identify changes in lifestyle to reduce recurrence of condition will improve Ability to verbalize feelings will improve Ability to disclose and discuss suicidal ideas Ability to demonstrate self-control will improve Ability to identify and develop effective coping behaviors will improve Ability to maintain clinical measurements within normal limits will improve Compliance with prescribed medications will improve Ability to identify triggers associated with substance abuse/mental health issues will improve Ability to identify changes in lifestyle to reduce recurrence of condition will improve Ability to verbalize feelings will improve Ability to disclose and discuss suicidal ideas Ability to demonstrate self-control will improve Ability to identify and develop effective coping behaviors will improve Ability to maintain clinical measurements within normal limits will improve Compliance with prescribed medications will improve Ability to identify triggers associated with substance abuse/mental health issues will improve     Medication Management: Evaluate patient's response, side effects, and tolerance of medication regimen.  Therapeutic Interventions: 1 to 1 sessions, Unit Group sessions and Medication administration.  Evaluation of Outcomes: Not Met   RN  Treatment Plan for Primary Diagnosis: Bipolar 1 Disorder  Long Term Goal(s): Knowledge of disease and therapeutic regimen to maintain health will improve  Short Term Goals: Ability to remain free from injury will improve and Ability to disclose and discuss suicidal ideas  Medication Management: RN will administer medications as ordered by provider, will assess and evaluate patient's response and provide education to patient for prescribed medication. RN will report any adverse and/or side effects to prescribing provider.  Therapeutic Interventions: 1 on 1 counseling sessions, Psychoeducation, Medication administration, Evaluate responses to treatment, Monitor vital signs and CBGs as ordered, Perform/monitor CIWA, COWS, AIMS and Fall Risk screenings as ordered, Perform wound care treatments as ordered.  Evaluation of Outcomes: Not Met   LCSW Treatment Plan for Primary Diagnosis: Bipolar 1 Disorder  Long Term Goal(s): Safe transition to appropriate next level of care at discharge, Engage patient in therapeutic group addressing interpersonal concerns.  Short Term Goals: Engage patient in aftercare planning with referrals and resources, Increase ability to appropriately verbalize feelings, Identify triggers associated with mental health/substance abuse issues and Increase skills for wellness and recovery  Therapeutic Interventions: Assess for all discharge needs, 1 to 1 time with Social worker, Explore available resources and support systems, Assess for adequacy in community support network, Educate family and significant other(s) on suicide prevention, Complete Psychosocial Assessment, Interpersonal group therapy.  Evaluation of Outcomes: Not Met   Progress in Treatment: Attending groups: Yes. Participating in groups: Yes. Taking medication as prescribed: Yes. Toleration medication: Yes. Family/Significant other contact  made: No, will contact:  collateral w patient consent, pt declining  consent at present Patient understands diagnosis: Yes. Discussing patient identified problems/goals with staff: Yes. Medical problems stabilized or resolved: Yes. Denies suicidal/homicidal ideation: No. and As evidenced by:  admitted w suicidal ideation and multiple stressors, coping skills inadequate for community stressors Issues/concerns per patient self-inventory: No. Other: na  New problem(s) identified: No, Describe:  none at this time  New Short Term/Long Term Goal(s):  Discharge Plan or Barriers: uninsured, reapplied for Medicaid, will need to switch providers  Reason for Continuation of Hospitalization: Depression Medication stabilization Suicidal ideation  Estimated Length of Stay: 3 - 5 days  Attendees: Patient: 08/03/2016 3:18 PM  Physician: Morrell Riddle MD 08/03/2016 3:18 PM  Nursing: Comer Locket RN 08/03/2016 3:18 PM  RN Care Manager:J Carlis Abbott RN CM 08/03/2016 3:18 PM  Social Worker: Eusebio Me LCSW 08/03/2016 3:18 PM  Recreational Therapist:  08/03/2016 3:18 PM  Other:  08/03/2016 3:18 PM  Other:  08/03/2016 3:18 PM  Other: 08/03/2016 3:18 PM    Scribe for Treatment Team: Beverely Pace, LCSW 08/03/2016 3:18 PM

## 2016-08-03 NOTE — [facility] Suicide Risk Assessment (Signed)
[facility] INPATIENT:  Family/Significant Other Suicide Prevention Education  Suicide Prevention Education:  Patient Refusal for Family/Significant Other Suicide Prevention Education: The patient Nicole Rangel has refused to provide written consent for family/significant other to be provided Family/Significant Other Suicide Prevention Education during admission and/or prior to discharge.  Physician notified.  Patient denies access to weapons at home. Does have access to medications via prescribed sources.    Sallee Langenne C Mionna Advincula 08/03/2016, 9:44 AM

## 2016-08-03 NOTE — BHH Suicide Risk Assessment (Signed)
[facility]   Nursing information obtained from:  Patient Demographic factors:  Unemployed Current Mental Status:  NA Loss Factors:  Decrease in vocational status, Financial problems / change in socioeconomic status, Decline in physical health Historical Factors:  Prior suicide attempts, Family history of mental illness or substance abuse Risk Reduction Factors:  Responsible for children under 37 years of age  Total Time spent with patient: 1 hour Principal Problem: <principal problem not specified> Diagnosis:   Patient Active Problem List   Diagnosis Date Noted  . Bipolar 1 disorder, depressed, severe (HCC) [F31.4] 08/02/2016  . Bipolar affective disorder, current episode depressed (HCC) [F31.30] 08/02/2016  . Morbid (severe) obesity due to excess calories (HCC) [E66.01] 03/19/2016  . Borderline diabetes mellitus [R73.03] 03/19/2016  . Acquired hypothyroidism [E03.9] 12/28/2015  . Headache(784.0) [R51] 03/23/2014  . Cephalalgia [R51] 03/23/2014  . Adult ADHD [F90.9] 03/11/2013  . Generalized anxiety disorder [F41.1] 03/11/2013  . Dyslipidemia [E78.5] 03/11/2013  . Bipolar disorder, unspecified [F31.9] 03/11/2013  . Urinary incontinence [R32] 03/11/2013  . Adult attention deficit disorder [F98.8] 03/11/2013  . Bipolar affective disorder (HCC) [F31.9] 03/11/2013   Subjective Data: see admission assessment  Continued Clinical Symptoms:  Alcohol Use Disorder Identification Test Final Score (AUDIT): 1 The "Alcohol Use Disorders Identification Test", Guidelines for Use in Primary Care, Second Edition.  World Science writerHealth Organization [facility](WHO). Score between 0-7:  no or low risk or alcohol related problems. Score between 8-15:  moderate risk of alcohol related problems. Score between 16-19:  high risk of alcohol related problems. Score 20 or above:  warrants further diagnostic evaluation for alcohol dependence and treatment.   CLINICAL FACTORS:   Bipolar Disorder:    Depressive phase   Musculoskeletal:see admission assessment Strength & Muscle Tone: within normal limits Gait & Station: normal Patient leans: N/A  Psychiatric Specialty Exam:see admission assessment Physical Exam  ROS  Blood pressure 104/78, pulse (!) 102, temperature 98.6 F (37 C), temperature source Oral, resp. rate 18, height 5\' 6"  (1.676 m), weight 105.7 kg (233 lb), SpO2 98 %.Body mass index is 37.61 kg/m.      COGNITIVE FEATURES THAT CONTRIBUTE TO RISK:  None    SUICIDE RISK:   Moderate:  Frequent suicidal ideation with limited intensity, and duration, some specificity in terms of plans, no associated intent, good self-control, limited dysphoria/symptomatology, some risk factors present, and identifiable protective factors, including available and accessible social support.   PLAN OF CARE: see admission assessment  I certify that inpatient services furnished can reasonably be expected to improve the patient's condition.  Wynelle BourgeoisUreh N Lekauwa, MD 08/03/2016, 1:12 PM

## 2016-08-03 NOTE — Progress Notes (Signed)
D: Patient has been up visible in the unit.  She has been pleasant.  She states she did not sleep well; her appetite is good; her energy is low; her concentration is poor. She denies any thoughts of self harm; AVH/HI.  Patient rates her depression as a 3; hopelessness as a 5; anxiety as an 8.  Patient was given a flu shot and she tolerated well. A: Continue to monitor medication management and MD orders.  Safety checks continued every 15 minutes per protocol.  Offer support and encouragement as needed.   R: Patient is receptive to staff; her behavior is appropriate.

## 2016-08-03 NOTE — BHH Counselor (Signed)
Adult Comprehensive Assessment  Patient ID: Nicole Rangel, female   DOB: 1980/11/21, 35 y.o.   MRN: [id number]  Information Source: Information source: Patient  Current Stressors:  Educational / Learning stressors: college degree Employment / Job issues: looking for a job, working w Film/video editor Family Relationships: has daughter Surveyor, quantity / Lack of resources (include bankruptcy): stressor is no job and concern about finances, lives in public housing, lost Medicaid this month Housing / Lack of housing: [employer] Physical health (include injuries & life threatening diseases): no issues related Social relationships: no friends Substance abuse: occasional alcohol Bereavement / Loss: mother deatd  Living/Environment/Situation:  Living Arrangements: Children Living conditions (as described by patient or guardian): lives in public housing w d77 year old daughter, stable How long has patient lived in current situation?: unknown What is atmosphere in current home: Supportive  Family History:  Marital status: Single Are you sexually active?: No What is your sexual orientation?: heterosexual Has your sexual activity been affected by drugs, alcohol, medication, or emotional stress?: unknown Does patient have children?: Yes How many children?: 1 How is patient's relationship with their children?: 10 year old daughter in working and in college, supportive, daughter will pick her up at discharge  Childhood History:  By whom was/is the patient raised?: Mother Description of patient's relationship with caregiver when they were a child: felt mother was neglectful, unsupportive; no contact w father, he "doesnt count" Patient's description of current relationship with people who raised him/her: mother dead, no contact w father How were you disciplined when you got in trouble as a child/adolescent?: "she just wasnt there for me" Does patient have siblings?: Yes Number of  Siblings: 2 Description of patient's current relationship with siblings: one older, one younger brother; younger brother is one who became concerned about her, "I was just taking medicine to help me sleep, I shouldnt have called him" Did patient suffer any verbal/emotional/physical/sexual abuse as a child?: No Did patient suffer from severe childhood neglect?: No Has patient ever been sexually abused/assaulted/raped as an adolescent or adult?: No Was the patient ever a victim of a crime or a disaster?: No Witnessed domestic violence?: No Has patient been effected by domestic violence as an adult?: No  Education:  Highest grade of school patient has completed: BA in Social Work Currently a Consulting civil engineer?: No Learning disability?: No  Employment/Work Situation:   Employment situation: Biomedical scientist job has been impacted by current illness: No What is the longest time patient has a held a job?: one year 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 Has patient ever been in the [employer]?: No Has patient ever served in combat?: No Did You Receive Any Psychiatric Treatment/Services While in Equities trader?: No Are There Guns or Other Weapons in Your Home?: No  Financial Resources:   Financial resources: No income (lost Medicaid this month, is applying for MCD and disabiilty early October) Does patient have a Lawyer or guardian?: No  Alcohol/Substance Abuse:   What has been your use of drugs/alcohol within the last 12 months?: occasional use of alcohol If attempted suicide, did drugs/alcohol play a role in this?: No Alcohol/Substance Abuse Treatment Hx: Denies past history Has alcohol/substance abuse ever caused legal problems?: No  Social Support System:   Patient's Community Support System: Good Describe Community Support System: "I have friends" Type of faith/religion: "spiritual" How does patient's faith help to cope with current illness?: "I believe in  God"  Leisure/Recreation:  Leisure and Hobbies: reading, uses [employer], loves to go to [employer]  Strengths/Needs:   What things does the patient do well?: cares for daughter, consistent in job searching In what areas does patient struggle / problems for patient: no income, no insurance, will need to change providers as she now has no insurance  Discharge Plan:   Does patient have access to transportation?: Yes (own car, daughter can pick her up at DC) Will patient be returning to same living situation after discharge?: Yes Currently receiving community mental health services: Yes (From Whom) (was at [facility], will need to change providers because she now has no insurance becaause daughter turned [age]) If no, would patient like referral for services when discharged?: Yes (What [place]?) [place] Does patient have financial barriers related to discharge medications?: Yes Patient description of barriers related to discharge medications: lack of insurance, wants help  Summary/Recommendations:   Summary and Recommendations (to be completed by the evaluator): Patient is a 35 year old female, admitted involuntarily and diagnosed with Bipolar Disorder.  Per patient, she took medications to help her sleep, brother was concerned about her and took her to ED.  Lives in public housing w 37 year old daughter, looking for a job w [employer] and [employer] resources.  Used to [facility] for [facility]s management and therapy, but will need to change providers until she gets reapproved for Medicaid.  Goals of hospitalization include elimination of suicidal ideation, increase in emotion regulation and coping skills.  [facility], would like Transitional Care Team if available to her.    Nicole Rangel. 08/03/2016

## 2016-08-03 NOTE — BHH Group Notes (Signed)
Nursing Orientation Group:  Patient did not attend.

## 2016-08-03 NOTE — H&P (Signed)
Psychiatric Admission Assessment Adult  Patient Identification: Nicole Rangel MRN:  [id number] Date of Evaluation:  08/03/2016 Chief Complaint:  Bipolar 1 disorder MDD REcurrent Severe Principal Diagnosis: <principal problem not specified> Diagnosis:   Patient Active Problem List   Diagnosis Date Noted  . Bipolar 1 disorder, depressed, severe ([place]) [F31.4] 08/02/2016  . Bipolar affective disorder, current episode depressed ([place]) [F31.30] 08/02/2016  . Morbid (severe) obesity due to excess calories ([place]) [E66.01] 03/19/2016  . Borderline diabetes mellitus [R73.03] 03/19/2016  . Acquired hypothyroidism [E03.9] 12/28/2015  . Headache(784.0) [R51] 03/23/2014  . Cephalalgia [R51] 03/23/2014  . Adult ADHD [F90.9] 03/11/2013  . Generalized anxiety disorder [F41.1] 03/11/2013  . Dyslipidemia [E78.5] 03/11/2013  . Bipolar disorder, unspecified [F31.9] 03/11/2013  . Urinary incontinence [R32] 03/11/2013  . Adult attention deficit disorder [F98.8] 03/11/2013  . Bipolar affective disorder ([place]) [F31.9] 03/11/2013   History of Present Illness: 35 year old female with long history of bipolar disorder who presented "y brother was uncomfortable with the way I decided to get some sleep" . Patient reports being under a lot of strss recently and took took more than the scheduled amount of xanax, trazodone    Associated Signs/Symptoms: Depression Symptoms:  depressed mood, insomnia, suicidal thoughts with specific plan, suicidal attempt, anxiety, (Hypo) Manic Symptoms:  None Anxiety Symptoms:  Excessive Worry, Psychotic Symptoms:  None PTSD Symptoms: None Total Time spent with patient: 1 hour  Past Psychiatric History: Patient has beeen in treatment with a behavioral health provider who has her on zoloft 266m, geodon 480mdaily, trazodone 10067mt bed time, synthroid 100m44mIs the patient at risk to self? Yes.    Has the patient been a risk to self in the past 6 months? Yes.    Has the  patient been a risk to self within the distant past? Yes.    Is the patient a risk to others? No.  Has the patient been a risk to others in the past 6 months? No.  Has the patient been a risk to others within the distant past? No.   Prior Inpatient Therapy:   Prior Outpatient Therapy:    Alcohol Screening: 1. How often do you have a drink containing alcohol?: Monthly or less 2. How many drinks containing alcohol do you have on a typical day when you are drinking?: 1 or 2 3. How often do you have six or more drinks on one occasion?: Never Preliminary Score: 0 9. Have you or someone else been injured as a result of your drinking?: No 10. Has a relative or friend or a doctor or another health worker been concerned about your drinking or suggested you cut down?: No Alcohol Use Disorder Identification Test Final Score (AUDIT): 1 Brief Intervention: AUDIT score less than 7 or less-screening does not suggest unhealthy drinking-brief intervention not indicated Substance Abuse History in the last 12 months:  No. Consequences of Substance Abuse: NA Previous Psychotropic Medications: Yes  Psychological Evaluations: No  Past Medical History:  Past Medical History:  Diagnosis Date  . ADHD (attention deficit hyperactivity disorder)   . Allergy   . Anxiety   . Asthma   . Bipolar 1 disorder (HCC)[place]. Depression   . Headache   . Mood disorder (HCC)[place]. Panic disorder    History reviewed. No pertinent surgical history. Family History:  Family History  Problem Relation Age of Onset  . Diabetes Mother    Family Psychiatric  History: Mother has depression and  some substance abuse Tobacco Screening: Have you used any form of tobacco in the last 30 days? (Cigarettes, Smokeless Tobacco, Cigars, and/or Pipes): No Social History:  History  Alcohol Use  . 0.0 oz/week    Comment: occasional     History  Drug Use No    Additional Social History: Marital status: Single Are you sexually active?:  No What is your sexual orientation?: heterosexual Has your sexual activity been affected by drugs, alcohol, medication, or emotional stress?: unknown Does patient have children?: Yes How many children?: 1 How is patient's relationship with their children?: 56 year old daughter in working and in college, supportive, daughter will pick her up at discharge    Pain Medications: See [facility] Prescriptions: See MAR  Over the Counter: See MAR History of alcohol / drug use?: No history of alcohol / drug abuse                    Allergies:   Allergies  Allergen Reactions  . Lidocaine Swelling    To face.   Lab Results:  Results for orders placed or performed during the hospital encounter of 08/01/16 (from the past 48 hour(s))  Comprehensive metabolic panel     Status: Abnormal   Collection Time: 08/01/16  9:43 PM  Result Value Ref Range   Sodium 135 135 - 145 mmol/L   Potassium 4.0 3.5 - 5.1 mmol/L   Chloride 104 101 - 111 mmol/L   CO2 23 22 - 32 mmol/L   Glucose, Bld 126 (H) 65 - 99 mg/dL   BUN 9 6 - 20 mg/dL   Creatinine, Ser 0.76 0.44 - 1.00 mg/dL   Calcium 9.4 8.9 - 10.3 mg/dL   Total Protein 7.3 6.5 - 8.1 g/dL   Albumin 4.1 3.5 - 5.0 g/dL   AST 16 15 - 41 U/L   ALT 13 (L) 14 - 54 U/L   Alkaline Phosphatase 44 38 - 126 U/L   Total Bilirubin 0.4 0.3 - 1.2 mg/dL   GFR calc non Af Amer >60 >60 mL/min   GFR calc Af Amer >60 >60 mL/min    Comment: (NOTE) The eGFR has been calculated using the CKD EPI equation. This calculation has not been validated in all clinical situations. eGFR's persistently <60 mL/min signify possible Chronic Kidney Disease.    Anion gap 8 5 - 15  cbc     Status: Abnormal   Collection Time: 08/01/16  9:43 PM  Result Value Ref Range   WBC 10.2 4.0 - 10.5 K/uL   RBC 5.44 (H) 3.87 - 5.11 MIL/uL   Hemoglobin 14.3 12.0 - 15.0 g/dL   HCT 44.4 36.0 - 46.0 %   MCV 81.6 78.0 - 100.0 fL   MCH 26.3 26.0 - 34.0 pg   MCHC 32.2 30.0 - 36.0 g/dL   RDW 13.4  11.5 - 15.5 %   Platelets 348 150 - 400 K/uL  Magnesium     Status: None   Collection Time: 08/01/16  9:43 PM  Result Value Ref Range   Magnesium 2.2 1.7 - 2.4 mg/dL  Ethanol     Status: None   Collection Time: 08/01/16  9:44 PM  Result Value Ref Range   Alcohol, Ethyl (B) <5 <5 mg/dL    Comment:        LOWEST DETECTABLE LIMIT FOR SERUM ALCOHOL IS 5 mg/dL FOR MEDICAL PURPOSES ONLY   Salicylate level     Status: None   Collection Time: 08/01/16  9:44 PM  Result Value Ref Range   Salicylate Lvl <1.5 2.8 - 30.0 mg/dL  Acetaminophen level     Status: Abnormal   Collection Time: 08/01/16  9:44 PM  Result Value Ref Range   Acetaminophen (Tylenol), Serum <10 (L) 10 - 30 ug/mL    Comment:        THERAPEUTIC CONCENTRATIONS VARY SIGNIFICANTLY. A RANGE OF 10-30 ug/mL MAY BE AN EFFECTIVE CONCENTRATION FOR MANY PATIENTS. HOWEVER, SOME ARE BEST TREATED AT CONCENTRATIONS OUTSIDE THIS RANGE. ACETAMINOPHEN CONCENTRATIONS >150 ug/mL AT 4 HOURS AFTER INGESTION AND >50 ug/mL AT 12 HOURS AFTER INGESTION ARE OFTEN ASSOCIATED WITH TOXIC REACTIONS.   CBG monitoring, ED     Status: Abnormal   Collection Time: 08/01/16  9:46 PM  Result Value Ref Range   Glucose-Capillary 100 (H) 65 - 99 mg/dL  I-Stat beta hCG blood, ED     Status: None   Collection Time: 08/01/16  9:53 PM  Result Value Ref Range   I-stat hCG, quantitative <5.0 <5 mIU/mL   Comment 3            Comment:   GEST. AGE      CONC.  (mIU/mL)   <=1 WEEK        5 - 50     2 WEEKS       50 - 500     3 WEEKS       100 - 10,000     4 WEEKS     1,000 - 30,000        FEMALE AND NON-PREGNANT FEMALE:     LESS THAN 5 mIU/mL   Rapid urine drug screen (hospital performed)     Status: Abnormal   Collection Time: 08/02/16 12:00 AM  Result Value Ref Range   Opiates NONE DETECTED NONE DETECTED   Cocaine NONE DETECTED NONE DETECTED   Benzodiazepines POSITIVE (A) NONE DETECTED   Amphetamines NONE DETECTED NONE DETECTED    Tetrahydrocannabinol NONE DETECTED NONE DETECTED   Barbiturates NONE DETECTED NONE DETECTED    Comment:        DRUG SCREEN FOR MEDICAL PURPOSES ONLY.  IF CONFIRMATION IS NEEDED FOR ANY PURPOSE, NOTIFY LAB WITHIN 5 DAYS.        LOWEST DETECTABLE LIMITS FOR URINE DRUG SCREEN Drug Class       Cutoff (ng/mL) Amphetamine      1000 Barbiturate      200 Benzodiazepine   400 Tricyclics       867 Opiates          300 Cocaine          300 THC              50   Urinalysis, Routine w reflex microscopic (not at [facility])     Status: None   Collection Time: 08/02/16 12:00 AM  Result Value Ref Range   Color, Urine YELLOW YELLOW   APPearance CLEAR CLEAR   Specific Gravity, Urine 1.005 1.005 - 1.030   pH 6.5 5.0 - 8.0   Glucose, UA NEGATIVE NEGATIVE mg/dL   Hgb urine dipstick NEGATIVE NEGATIVE   Bilirubin Urine NEGATIVE NEGATIVE   Ketones, ur NEGATIVE NEGATIVE mg/dL   Protein, ur NEGATIVE NEGATIVE mg/dL   Nitrite NEGATIVE NEGATIVE   Leukocytes, UA NEGATIVE NEGATIVE    Comment: MICROSCOPIC NOT DONE ON URINES WITH NEGATIVE PROTEIN, BLOOD, LEUKOCYTES, NITRITE, OR GLUCOSE <1000 mg/dL.    Blood Alcohol level:  Lab Results  Component Value Date  ETH <5 40/05/6760    Metabolic Disorder Labs:  Lab Results  Component Value Date   HGBA1C 5.5 03/11/2013   MPG 111 03/11/2013   No results found for: PROLACTIN Lab Results  Component Value Date   CHOL 216 (H) 03/11/2013   TRIG 149 03/11/2013   HDL 49 03/11/2013   CHOLHDL 4.4 03/11/2013   VLDL 30 03/11/2013   LDLCALC 137 (H) 03/11/2013    Current Medications: Current Facility-Administered Medications  Medication Dose Route Frequency Provider Last Rate Last Dose  . acetaminophen (TYLENOL) tablet 650 mg  650 mg Oral Q4H PRN Patrecia Pour, NP      . alum & mag hydroxide-simeth (MAALOX/MYLANTA) 200-200-20 MG/5ML suspension 30 mL  30 mL Oral PRN Patrecia Pour, NP      . cholecalciferol (VITAMIN D) tablet 1,000 Units  1,000 Units Oral Daily  Patrecia Pour, NP   1,000 Units at 08/03/16 0813  . ibuprofen (ADVIL,MOTRIN) tablet 600 mg  600 mg Oral Q8H PRN Patrecia Pour, NP      . levothyroxine (SYNTHROID, LEVOTHROID) tablet 100 mcg  100 mcg Oral QAC breakfast Patrecia Pour, NP   100 mcg at 08/03/16 0610  . magnesium hydroxide (MILK OF MAGNESIA) suspension 30 mL  30 mL Oral Daily PRN Patrecia Pour, NP      . nicotine (NICODERM CQ - dosed in mg/24 hours) patch 21 mg  21 mg Transdermal Daily Patrecia Pour, NP      . ondansetron [facility]) tablet 4 mg  4 mg Oral Q8H PRN Patrecia Pour, NP      . Oxcarbazepine (TRILEPTAL) tablet 300 mg  300 mg Oral BID Patrecia Pour, NP      . traZODone (DESYREL) tablet 100 mg  100 mg Oral QHS Patrecia Pour, NP   100 mg at 08/02/16 2115  . ziprasidone (GEODON) capsule 40 mg  40 mg Oral Daily Patrecia Pour, NP       PTA Medications: Prescriptions Prior to Admission  Medication Sig Dispense Refill Last Dose  . cholecalciferol (VITAMIN D) 1000 UNITS tablet Take 1,000 Units by mouth daily.   07/31/2016 at Unknown time  . ferrous sulfate 325 (65 FE) MG tablet Take by mouth.   07/31/2016 at Unknown time  . ibuprofen (ADVIL,MOTRIN) 200 MG tablet Take 800 mg by mouth every 6 (six) hours as needed for moderate pain.   08/01/2016 at Unknown time  . levothyroxine (SYNTHROID, LEVOTHROID) 100 MCG tablet Take 1 tablet by mouth daily.   08/01/2016 at Unknown time  . phentermine (ADIPEX-P) 37.5 MG tablet Take 1 tablet by mouth daily.  1 08/01/2016 at Unknown time  . sertraline (ZOLOFT) 100 MG tablet Take 200 mg by mouth daily.    08/01/2016 at Unknown time  . traZODone (DESYREL) 100 MG tablet Take 100 mg by mouth at bedtime.    08/01/2016 at Unknown time  . ziprasidone (GEODON) 20 MG capsule Take 40 mg by mouth daily.    07/31/2016 at Unknown time    Musculoskeletal: Strength & Muscle Tone: within normal limits Gait & Station: normal Patient leans: N/A  Psychiatric Specialty Exam: Physical Exam  ROS  Blood  pressure 104/78, pulse (!) 102, temperature 98.6 F (37 C), temperature source Oral, resp. rate 18, height '5\' 6"'  (1.676 m), weight 105.7 kg (233 lb), SpO2 98 %.Body mass index is 37.61 kg/m.  General Appearance: Casual  Eye Contact:  Good  Speech:  Coherent and goal directed  Volume:  Normal  Mood:  Anxious and Depressed  Affect:  Congruent  Thought Process:  Coherent and Goal Directed  Orientation:  Full (Time, Place, and Person)  Thought Content:  Logical  Suicidal Thoughts:  Yes.  with intent/plan  Homicidal Thoughts:  No  Memory:  Immediate;   Fair Recent;   Fair Remote;   Fair  Judgement:  Impaired  Insight:  Lacking  Psychomotor Activity:  Normal  Concentration:  Concentration: Fair and Attention Span: Fair  Recall:  [employer] of Knowledge:  Fair  Language:  Fair  Akathisia:  No  Handed:  Right  AIMS (if indicated):     Assets:  Communication Skills Desire for Improvement Housing Social Support  ADL's:  Intact  Cognition:  WNL  Sleep:  Number of Hours: 6.5    Treatment Plan Summary: Daily contact with patient to assess and evaluate symptoms and progress in treatment and Medication management  Observation Level/Precautions:  15 minute checks  Laboratory:    Psychotherapy:    Medications:  Continue zoloft, geodon phentermine ad synthroid  Consultations:    Discharge Concerns:    Estimated LOS:5-7 days  Other:     Physician Treatment Plan for Primary Diagnosis: <principal problem not specified> Long Term Goal(s): Improvement in symptoms so as ready for discharge  Short Term Goals: Ability to identify changes in lifestyle to reduce recurrence of condition will improve, Ability to verbalize feelings will improve, Ability to disclose and discuss suicidal ideas, Ability to demonstrate self-control will improve, Ability to identify and develop effective coping behaviors will improve, Ability to maintain clinical measurements within normal limits will improve,  Compliance with prescribed medications will improve and Ability to identify triggers associated with substance abuse/mental health issues will improve  Physician Treatment Plan for Secondary Diagnosis: Active Problems:   Bipolar affective disorder, current episode depressed ([place])  Long Term Goal(s): Improvement in symptoms so as ready for discharge  Short Term Goals: Ability to identify changes in lifestyle to reduce recurrence of condition will improve, Ability to verbalize feelings will improve, Ability to disclose and discuss suicidal ideas, Ability to demonstrate self-control will improve, Ability to identify and develop effective coping behaviors will improve, Ability to maintain clinical measurements within normal limits will improve, Compliance with prescribed medications will improve and Ability to identify triggers associated with substance abuse/mental health issues will improve  I certify that inpatient services furnished can reasonably be expected to improve the patient's condition.    Ruffin Frederick, MD 10/26/201712:39 PM

## 2016-08-04 MED ORDER — SERTRALINE HCL 100 MG PO TABS
200.0000 mg | ORAL_TABLET | Freq: Every day | ORAL | Status: DC
  Administered 2016-08-04 – 2016-08-08 (×5): 200 mg via ORAL
  Filled 2016-08-04 (×9): qty 2

## 2016-08-04 MED ORDER — HYDROXYZINE HCL 25 MG PO TABS
ORAL_TABLET | ORAL | Status: AC
  Filled 2016-08-04: qty 1

## 2016-08-04 MED ORDER — LEVOTHYROXINE SODIUM 100 MCG PO TABS: 100.0000 ug | ORAL_TABLET | Freq: Every day | ORAL | Status: DC

## 2016-08-04 MED ORDER — FERROUS SULFATE 325 (65 FE) MG PO TABS
325.0000 mg | ORAL_TABLET | Freq: Every day | ORAL | Status: DC
  Administered 2016-08-04 – 2016-08-08 (×5): 325 mg via ORAL
  Filled 2016-08-04 (×9): qty 1

## 2016-08-04 MED ORDER — HYDROXYZINE HCL 25 MG PO TABS
25.0000 mg | ORAL_TABLET | Freq: Four times a day (QID) | ORAL | Status: DC | PRN
  Administered 2016-08-04 – 2016-08-08 (×5): 25 mg via ORAL
  Filled 2016-08-04 (×2): qty 1
  Filled 2016-08-04: qty 10
  Filled 2016-08-04 (×2): qty 1

## 2016-08-04 NOTE — [facility] Group Notes (Signed)
[facility]  Date/Time: 08/04/2016   1:30PM  Type of Therapy and Topic:  Group Therapy:  Who Am I?  Self Esteem, Self-Actualization and Understanding Self.  Participation Level:  Active  Description of Group:    In this group patients will be asked to explore values, beliefs, truths, and morals as they relate to personal self.  Patients will be guided to discuss their thoughts, feelings, and behaviors related to what they identify as important to their true self. Patients will process together how values, beliefs and truths are connected to specific choices patients make every day. Each patient will be challenged to identify changes that they are motivated to make in order to improve self-esteem and self-actualization. This group will be process-oriented, with patients participating in exploration of their own experiences as well as giving and receiving support and challenge from other group members.  Therapeutic Goals: 1. Patient will identify false beliefs that currently interfere with their self-esteem.  2. Patient will identify feelings, thought process, and behaviors related to self and will become aware of the uniqueness of themselves and of others.  3. Patient will be able to identify and verbalize values, morals, and beliefs as they relate to self. 4. Patient will begin to learn how to build self-esteem/self-awareness by expressing what is important and unique to them personally.  Summary of Patient Progress  Patient discussed her tendency to compare herself to others. She left group early without returning.     Therapeutic Modalities:   Cognitive Behavioral Therapy Solution Focused Therapy Motivational Interviewing Brief Therapy   Samuella BruinKristin Sylvio Weatherall, LCSW Clinical Social Worker [facility] [phone]

## 2016-08-04 NOTE — Progress Notes (Signed)
Patient ID: Nicole Rangel, female   DOB: November 17, 1980, 35 y.o.   MRN: [id number]  Pt currently presents with a blunted affect and anxious behavior. Pt reports to writer that their goal is to "figure out how I can calm down because I have none of my coping skills available here." Pt reports she usually listens to music, drinks hot tea and plays with her cat. Pt states "I am trying to get better." Pt reports good sleep with current medication regimen.    Pt provided with medications per providers orders. Pt's labs and vitals were monitored throughout the night. Pt supported emotionally and encouraged to express concerns and questions. Pt educated on medications and deep breathing. Pt allowed to listen to relaxing song at nurses station, given a cup of hot tea. Pt states "Thanks, that helped a lot."   Pt's safety ensured with 15 minute and environmental checks. Pt currently denies SI/HI and A/V hallucinations. Pt verbally agrees to seek staff if SI/HI or A/VH occurs and to consult with staff before acting on any harmful thoughts. Will continue POC.

## 2016-08-04 NOTE — Progress Notes (Signed)
[facility] MD Progress Note  08/04/2016 11:44 AM Nicole Rangel  MRN:  [id number] Subjective:  Met with the treatment team to discuss patient today and they Principal Problem: Bipolar Disorder Depressed Diagnosis:   Patient Active Problem List   Diagnosis Date Noted  . Bipolar 1 disorder, depressed, severe (HCC) [F31.4] 08/02/2016  . Bipolar affective disorder, current episode depressed (HCC) [F31.30] 08/02/2016  . Morbid (severe) obesity due to excess calories (HCC) [E66.01] 03/19/2016  . Borderline diabetes mellitus [R73.03] 03/19/2016  . Acquired hypothyroidism [E03.9] 12/28/2015  . Headache(784.0) [R51] 03/23/2014  . Cephalalgia [R51] 03/23/2014  . Adult ADHD [F90.9] 03/11/2013  . Generalized anxiety disorder [F41.1] 03/11/2013  . Dyslipidemia [E78.5] 03/11/2013  . Bipolar disorder, unspecified [F31.9] 03/11/2013  . Urinary incontinence [R32] 03/11/2013  . Adult attention deficit disorder [F98.8] 03/11/2013  . Bipolar affective disorder (HCC) [F31.9] 03/11/2013   Total Time spent with patient: 30 minutes  Past Psychiatric History: see admission assessment  Past Medical History:  Past Medical History:  Diagnosis Date  . ADHD (attention deficit hyperactivity disorder)   . Allergy   . Anxiety   . Asthma   . Bipolar 1 disorder (HCC)   . Depression   . Headache   . Mood disorder (HCC)   . Panic disorder    History reviewed. No pertinent surgical history. Family History:  Family History  Problem Relation Age of Onset  . Diabetes Mother    Family Psychiatric  History: see admission assessment Social History:  History  Alcohol Use  . 0.0 oz/week    Comment: occasional     History  Drug Use No    Social History   Social History  . Marital status: Single    Spouse name: N/A  . Number of children: 1  . Years of education: BS   Occupational History  . N/A    Social History Main Topics  . Smoking status: Never Smoker  . Smokeless tobacco: Never Used  . Alcohol  use 0.0 oz/week     Comment: occasional  . Drug use: No  . Sexual activity: Not Asked   Other Topics Concern  . None   Social History Narrative   Patient is single with 1 child   Patient is right handed   Patient's education level is Bachelor's degree   Patient doesn't drink caffeine, only caffeine free   Additional Social History:    Pain Medications: See MAR Prescriptions: See MAR  Over the Counter: See MAR History of alcohol / drug use?: No history of alcohol / drug abuse                    Sleep: Fair  Appetite:  Fair  Current Medications: Current Facility-Administered Medications  Medication Dose Route Frequency Provider Last Rate Last Dose  . acetaminophen (TYLENOL) tablet 650 mg  650 mg Oral Q4H PRN Jamison Y Lord, NP      . alum & mag hydroxide-simeth (MAALOX/MYLANTA) 200-200-20 MG/5ML suspension 30 mL  30 mL Oral PRN Jamison Y Lord, NP      . cholecalciferol (VITAMIN D) tablet 1,000 Units  1,000 Units Oral Daily Jamison Y Lord, NP   1,000 Units at 08/04/16 0837  . ibuprofen (ADVIL,MOTRIN) tablet 600 mg  600 mg Oral Q8H PRN Jamison Y Lord, NP   600 mg at 08/03/16 1959  . levothyroxine (SYNTHROID, LEVOTHROID) tablet 100 mcg  100 mcg Oral QAC breakfast Jamison Y Lord, NP   100 mcg at 08/04/16 0641  .   magnesium hydroxide (MILK OF MAGNESIA) suspension 30 mL  30 mL Oral Daily PRN Jamison Y Lord, NP      . nicotine (NICODERM CQ - dosed in mg/24 hours) patch 21 mg  21 mg Transdermal Daily Jamison Y Lord, NP      . ondansetron (ZOFRAN) tablet 4 mg  4 mg Oral Q8H PRN Jamison Y Lord, NP      . traZODone (DESYREL) tablet 200 mg  200 mg Oral QHS  N , MD   200 mg at 08/03/16 2244  . ziprasidone (GEODON) capsule 40 mg  40 mg Oral Daily  N , MD   40 mg at 08/03/16 1831    Lab Results: No results found for this or any previous visit (from the past 48 hour(s)).  Blood Alcohol level:  Lab Results  Component Value Date   ETH <5 08/01/2016     Metabolic Disorder Labs: Lab Results  Component Value Date   HGBA1C 5.5 03/11/2013   MPG 111 03/11/2013   No results found for: PROLACTIN Lab Results  Component Value Date   CHOL 216 (H) 03/11/2013   TRIG 149 03/11/2013   HDL 49 03/11/2013   CHOLHDL 4.4 03/11/2013   VLDL 30 03/11/2013   LDLCALC 137 (H) 03/11/2013    Physical Findings: AIMS: Facial and Oral Movements Muscles of Facial Expression: None, normal Lips and Perioral Area: None, normal Jaw: None, normal Tongue: None, normal,Extremity Movements Upper (arms, wrists, hands, fingers): None, normal Lower (legs, knees, ankles, toes): None, normal, Trunk Movements Neck, shoulders, hips: None, normal, Overall Severity Severity of abnormal movements (highest score from questions above): None, normal Incapacitation due to abnormal movements: None, normal Patient's awareness of abnormal movements (rate only patient's report): No Awareness, Dental Status Current problems with teeth and/or dentures?: No Does patient usually wear dentures?: No  CIWA:    COWS:     Musculoskeletal: Strength & Muscle Tone: within normal limits Gait & Station: normal Patient leans: N/A  Psychiatric Specialty Exam: Physical Exam  Constitutional: She appears well-developed and well-nourished.    ROS  Blood pressure 104/78, pulse (!) 102, temperature 98.6 F (37 C), temperature source Oral, resp. rate 18, height 5' 6" (1.676 m), weight 105.7 kg (233 lb), SpO2 98 %.Body mass index is 37.61 kg/m.  General Appearance: Casual  Eye Contact:  Good  Speech:  Clear and Coherent and Normal Rate  Volume:  Normal  Mood:  Depressed and Dysphoric  Affect:  Congruent  Thought Process:  Coherent and Goal Directed  Orientation:  Full (Time, Place, and Person)  Thought Content:  Logical  Suicidal Thoughts:  Yes.  without intent/plan  Homicidal Thoughts:  No  Memory:  Immediate;   Fair Recent;   Fair Remote;   Fair  Judgement:  Fair  Insight:   Fair  Psychomotor Activity:  Normal  Concentration:  Concentration: Fair and Attention Span: Fair  Recall:  Fair  Fund of Knowledge:  Fair  Language:  Fair  Akathisia:  No  Handed:  Right  AIMS (if indicated):     Assets:  Communication Skills Desire for Improvement Financial Resources/Insurance Housing Physical Health Social Support Transportation  ADL's:  Intact  Cognition:  WNL  Sleep:  Number of Hours: 6     Treatment Plan Summary: Daily contact with patient to assess and evaluate symptoms and progress in treatment and Medication management Continue zoloft 200mg daily Continue feso4 325 mg daily  N Lekauwa, MD 08/04/2016, 11:44 AM 

## 2016-08-04 NOTE — Progress Notes (Signed)
Recreation Therapy Notes  Date: 08/04/16 Time: 0930 Location: [address] Dayroom  Group Topic: Stress Management  Goal Area(s) Addresses:  Patient will verbalize importance of using healthy stress management.  Patient will identify positive emotions associated with healthy stress management.   Intervention: Stress Management  Activity :  [place].  LRT introduced the stress management technique of guided imagery.  LRT read a script to engage patients in the technique.  Patients were to follow along as LRT read the script.  Education:  Stress Management, Discharge Planning.   Education Outcome: Acknowledges edcuation/In group clarification offered/Needs additional education  Clinical Observations/Feedback: Pt did not attend group.     Renelle Stegenga, LRT/CTRS         Jazara Swiney A 08/04/2016 11:33 AM 

## 2016-08-04 NOTE — Progress Notes (Signed)
Adult Psychoeducational Group Note  Date:  08/04/2016 Time:  8:52 PM  Group Topic/Focus:  Wrap-Up Group:   The focus of this group is to help patients review their daily goal of treatment and discuss progress on daily workbooks.   Participation Level:  Active  Participation Quality:  Appropriate  Affect:  Appropriate  Cognitive:  Appropriate  Insight: Appropriate  Engagement in Group:  Engaged  Modes of Intervention:  Discussion  Additional Comments:  Pt goal for today was to remain calm and relaxed. Pt stated that goal was reached. Pt rated today with a 7. Margorie Johnege, Ameen Mostafa 08/04/2016, 8:52 PM

## 2016-08-05 DIAGNOSIS — R45851 Suicidal ideations: Secondary | ICD-10-CM

## 2016-08-05 DIAGNOSIS — Z79899 Other long term (current) drug therapy: Secondary | ICD-10-CM

## 2016-08-05 DIAGNOSIS — F3132 Bipolar disorder, current episode depressed, moderate: Secondary | ICD-10-CM

## 2016-08-05 DIAGNOSIS — Z833 Family history of diabetes mellitus: Secondary | ICD-10-CM

## 2016-08-05 MED ORDER — ZIPRASIDONE HCL 20 MG PO CAPS: 20.0000 mg | ORAL_CAPSULE | Freq: Two times a day (BID) | ORAL | Status: DC

## 2016-08-05 MED ORDER — ARIPIPRAZOLE 2 MG PO TABS
2.0000 mg | ORAL_TABLET | Freq: Every day | ORAL | Status: DC
  Administered 2016-08-06 – 2016-08-07 (×2): 2 mg via ORAL
  Filled 2016-08-05 (×4): qty 1

## 2016-08-05 NOTE — Progress Notes (Signed)
[facility] MD Progress Note  08/05/2016 4:20 PM Nicole Rangel  MRN:  [id number] Subjective:  "I'm feeling a lot better today actually."  Objective: Pt seen and chart reviewed. Pt is alert/oriented x4, calm, cooperative, and appropriate to situation. Pt denies suicidal/homicidal ideation and psychosis and does not appear to be responding to internal stimuli. Pt reports that she slept well, has good appetite, and has been participating in group with cited benefit of improved coping skills.   Principal Problem: Bipolar affective disorder, current episode depressed (HCC)  Diagnosis:   Patient Active Problem List   Diagnosis Date Noted  . Bipolar affective disorder, current episode depressed (HCC) [F31.30] 08/02/2016    Priority: High  . Bipolar 1 disorder, depressed, severe (HCC) [F31.4] 08/02/2016  . Morbid (severe) obesity due to excess calories (HCC) [E66.01] 03/19/2016  . Borderline diabetes mellitus [R73.03] 03/19/2016  . Acquired hypothyroidism [E03.9] 12/28/2015  . Headache(784.0) [R51] 03/23/2014  . Cephalalgia [R51] 03/23/2014  . Adult ADHD [F90.9] 03/11/2013  . Generalized anxiety disorder [F41.1] 03/11/2013  . Dyslipidemia [E78.5] 03/11/2013  . Bipolar disorder, unspecified [F31.9] 03/11/2013  . Urinary incontinence [R32] 03/11/2013  . Adult attention deficit disorder [F98.8] 03/11/2013  . Bipolar affective disorder (HCC) [F31.9] 03/11/2013   Total Time spent with patient: 25 minutes  Past Psychiatric History: see admission assessment  Past Medical History:  Past Medical History:  Diagnosis Date  . ADHD (attention deficit hyperactivity disorder)   . Allergy   . Anxiety   . Asthma   . Bipolar 1 disorder (HCC)   . Depression   . Headache   . Mood disorder (HCC)   . Panic disorder    History reviewed. No pertinent surgical history. Family History:  Family History  Problem Relation Age of Onset  . Diabetes Mother    Family Psychiatric  History: see admission  assessment Social History:  History  Alcohol Use  . 0.0 oz/week    Comment: occasional     History  Drug Use No    Social History   Social History  . Marital status: Single    Spouse name: N/A  . Number of children: 1  . Years of education: BS   Occupational History  . N/A    Social History Main Topics  . Smoking status: Never Smoker  . Smokeless tobacco: Never Used  . Alcohol use 0.0 oz/week     Comment: occasional  . Drug use: No  . Sexual activity: Not Asked   Other Topics Concern  . None   Social History Narrative   Patient is single with 1 child   Patient is right handed   Patient's education level is Bachelor's degree   Patient doesn't drink caffeine, only caffeine free   Additional Social History:    Pain Medications: See MAR Prescriptions: See MAR  Over the Counter: See MAR History of alcohol / drug use?: No history of alcohol / drug abuse                    Sleep: Good  Appetite:  Good  Current Medications: Current Facility-Administered Medications  Medication Dose Route Frequency Provider Last Rate Last Dose  . acetaminophen (TYLENOL) tablet 650 mg  650 mg Oral Q4H PRN Charm Rings, NP      . alum & mag hydroxide-simeth (MAALOX/MYLANTA) 200-200-20 MG/5ML suspension 30 mL  30 mL Oral PRN Charm Rings, NP      . cholecalciferol (VITAMIN D) tablet 1,000 Units  1,000 Units  Oral Daily Charm RingsJamison Y Lord, NP   1,000 Units at 08/05/16 [phone]  . ferrous sulfate tablet 325 mg  325 mg Oral Daily Molinda BailiffUreh N Lekwauwa, MD   325 mg at 08/05/16 0806  . hydrOXYzine (ATARAX/VISTARIL) tablet 25 mg  25 mg Oral Q6H PRN Molinda BailiffUreh N Lekwauwa, MD   25 mg at 08/04/16 2106  . ibuprofen (ADVIL,MOTRIN) tablet 600 mg  600 mg Oral Q8H PRN Charm RingsJamison Y Lord, NP   600 mg at 08/03/16 1959  . levothyroxine (SYNTHROID, LEVOTHROID) tablet 100 mcg  100 mcg Oral QAC breakfast Charm RingsJamison Y Lord, NP   100 mcg at 08/05/16 0700  . magnesium hydroxide (MILK OF MAGNESIA) suspension 30 mL  30 mL  Oral Daily PRN Charm RingsJamison Y Lord, NP      . nicotine (NICODERM CQ - dosed in mg/24 hours) patch 21 mg  21 mg Transdermal Daily Charm RingsJamison Y Lord, NP      . ondansetron [facility](ZOFRAN) tablet 4 mg  4 mg Oral Q8H PRN Charm RingsJamison Y Lord, NP      . sertraline (ZOLOFT) tablet 200 mg  200 mg Oral Daily Molinda BailiffUreh N Lekwauwa, MD   200 mg at 08/05/16 0806  . traZODone (DESYREL) tablet 200 mg  200 mg Oral QHS Molinda BailiffUreh N Lekwauwa, MD   200 mg at 08/04/16 2105  . ziprasidone (GEODON) capsule 40 mg  40 mg Oral Daily Molinda BailiffUreh N Lekwauwa, MD   40 mg at 08/04/16 1705    Lab Results: No results found for this or any previous visit (from the past 48 hour(s)).  Blood Alcohol level:  Lab Results  Component Value Date   ETH <5 08/01/2016    Metabolic Disorder Labs: Lab Results  Component Value Date   HGBA1C 5.5 03/11/2013   MPG 111 03/11/2013   No results found for: PROLACTIN Lab Results  Component Value Date   CHOL 216 (H) 03/11/2013   TRIG 149 03/11/2013   HDL 49 03/11/2013   CHOLHDL 4.4 03/11/2013   VLDL 30 03/11/2013   LDLCALC 137 (H) 03/11/2013    Physical Findings: AIMS: Facial and Oral Movements Muscles of Facial Expression: None, normal Lips and Perioral Area: None, normal Jaw: None, normal Tongue: None, normal,Extremity Movements Upper (arms, wrists, hands, fingers): None, normal Lower (legs, knees, ankles, toes): None, normal, Trunk Movements Neck, shoulders, hips: None, normal, Overall Severity Severity of abnormal movements (highest score from questions above): None, normal Incapacitation due to abnormal movements: None, normal Patient's awareness of abnormal movements (rate only patient's report): No Awareness, Dental Status Current problems with teeth and/or dentures?: No Does patient usually wear dentures?: No  CIWA:    COWS:     Musculoskeletal: Strength & Muscle Tone: within normal limits Gait & Station: normal Patient leans: N/A  Psychiatric Specialty Exam: Physical Exam  Constitutional: She  appears well-developed and well-nourished.    Review of Systems  Psychiatric/Behavioral: Positive for depression. Negative for hallucinations and suicidal ideas. The patient is nervous/anxious and has insomnia.   All other systems reviewed and are negative.   Blood pressure 101/76, pulse (!) 101, temperature 99 F (37.2 C), resp. rate 20, height 5\' 6"  (1.676 m), weight 105.7 kg (233 lb), SpO2 98 %.Body mass index is 37.61 kg/m.  General Appearance: Casual and Fairly Groomed  Eye Contact:  Good  Speech:  Clear and Coherent and Normal Rate  Volume:  Normal  Mood:  Anxious and Depressed  Affect:  Congruent  Thought Process:  Coherent and Goal Directed  Orientation:  Full (Time, Place, and Person)  Thought Content:  Symptoms, worries, concerns  Suicidal Thoughts:  Yes.  without intent/plan improving and is able to contract for safety  Homicidal Thoughts:  No  Memory:  Immediate;   Fair Recent;   Fair Remote;   Fair  Judgement:  Fair  Insight:  Fair  Psychomotor Activity:  Normal  Concentration:  Concentration: Fair and Attention Span: Fair  Recall:  [employer]Fair  Fund of Knowledge:  Fair  Language:  Fair  Akathisia:  No  Handed:  Right  AIMS (if indicated):     Assets:  Communication Skills Desire for Improvement Financial Resources/Insurance Housing Physical Health Social Support Transportation  ADL's:  Intact  Cognition:  WNL  Sleep:  Number of Hours: 6.75   Treatment Plan Summary: Bipolar affective disorder, current episode depressed (HCC) unstable, yet improving, managed as below:   Medications:  -Continue Zoloft 200mg  daily for depressive symptoms -Discontinue Geodon (Qtc was 530, critical, will recheck) -STAT EKG 12-lead for baseline Qtc to determine if worse  -Start Abilify 2mg  po daily (tomorrow) for mood stabilization and manic phase of Bipolar -Continue home Ferrous 325mg  daily -Continue Vistaril 25mg  po q6h prn anxiety -Continue home Synthroid 100mcg  Daily  contact with patient to assess and evaluate symptoms and progress in treatment and Medication management  Beau FannyWithrow, Hicks Feick C, FNP 08/05/2016, 4:20 PM

## 2016-08-05 NOTE — [facility] Group Notes (Signed)
[facility] Group Notes: (Clinical Social Work)   08/05/2016      Type of Therapy:  Group Therapy   Participation Level:  Did Not Attend despite MHT prompting   Ambrose MantleMareida Grossman-Orr, LCSW 08/05/2016, 12:20 PM

## 2016-08-05 NOTE — Progress Notes (Signed)
Patient ID: Nicole Rangel, female   DOB: November 25, 1980, 35 y.o.   MRN: [id number]    D: Pt has been very flat and depressed on the unit today. Pt did not attend any groups nor did she engage in any treatment. Pt reported that her depression was a 1, her hopelessness was a 0, and her anxiety was a 7. Pt took all medications as prescribed by the doctor, no issues or concerns noted. Pt reported that her goal for today was to confirm employment. Pt reported being negative SI/HI, no AH/VH noted. A: 15 min checks continued for patient safety. R: Pt safety maintained.

## 2016-08-05 NOTE — [facility] Group Notes (Signed)
[facility] Group Notes:  (Nursing/MHT/Case Management/Adjunct)  Date:  08/05/2016  Time:  11:18 AM  Type of Therapy:  Psychoeducational Skills  Participation Level:  Did Not Attend  Participation Quality:  Did Not Attend  Affect:  Did Not Attend  Cognitive:  Did Not Attend  Insight:  None  Engagement in Group:  Did Not Attend  Modes of Intervention:  Did Not Attend  Summary of Progress/Problems: Pt did not attend patient self inventory group.   Jacquelyne BalintForrest, Belky Mundo Shanta 08/05/2016, 11:18 AM

## 2016-08-05 NOTE — Progress Notes (Signed)
Adult Psychoeducational Group Note  Date:  08/05/2016 Time:  8:43 PM  Group Topic/Focus:  Wrap-Up Group:   The focus of this group is to help patients review their daily goal of treatment and discuss progress on daily workbooks.   Participation Level:  Active  Participation Quality:  Appropriate  Affect:  Appropriate  Cognitive:  Alert, Appropriate and Oriented  Insight: Appropriate  Engagement in Group:  Engaged  Modes of Intervention:  Discussion  Additional Comments:  Patient attended wrap-up group and said that her day was a 4.  Her goal for today was to speak to her physician and she did, but her issues are still not settled. She intends to ask to speak to him tomorrow. Her coping skill is deep breathing.  Taniyah Ballow W Naasia Weilbacher 08/05/2016, 8:43 PM

## 2016-08-05 NOTE — Progress Notes (Signed)
D   Pt complains of a headache    She has been sitting in the dayroom playing cards with her peers    Her behaviors are appropriate   She endorses depression and anxiety A   Pt was encouraged to lie down to help ease H/A    Verbal support given   Medications administered and effectiveness monitored    Q 15 min checks  R   Pt said she did not want to leave the card game to relieve her headache    She said it woulld not make any difference    Pt is safe at present

## 2016-08-06 LAB — LIPID PANEL
CHOL/HDL RATIO: 4.4 ratio
Cholesterol: 214 mg/dL — ABNORMAL HIGH (ref 0–200)
HDL: 49 mg/dL (ref >40)
LDL Cholesterol: 146 mg/dL — ABNORMAL HIGH (ref 0–99)
Triglycerides: 97 mg/dL (ref <150)
VLDL: 19 mg/dL (ref 0–40)

## 2016-08-06 LAB — TSH: TSH: 0.875 u[IU]/mL (ref 0.350–4.500)

## 2016-08-06 MED ORDER — ACETAMINOPHEN 325 MG PO TABS: 325.0000 mg | ORAL_TABLET | Freq: Four times a day (QID) | ORAL | Status: DC | PRN

## 2016-08-06 MED ORDER — BUTALBITAL-APAP-CAFFEINE 50-325-40 MG PO TABS
1.0000 | ORAL_TABLET | Freq: Four times a day (QID) | ORAL | Status: DC | PRN
  Administered 2016-08-06 – 2016-08-07 (×3): 1 via ORAL
  Filled 2016-08-06 (×3): qty 1

## 2016-08-06 NOTE — Progress Notes (Signed)
D) Pt has spent most of the day withdrawn and in his room. Comes out for medications and for meals, otherwise is withdrawn. Denies SI and HI. Rates his depression at an 8, hopelessness at an 8 and his anxiety at a 0. Pt. States one of his goals is "to stop hearing voices".  Pt's CBG's today have been 357 at lunch and 241 at dinner. Both were covered with his sliding scale insulin. Affect remains flat and mood depressed. Pt was on the phone with someone (would not say who) and was firmly telling this person to "stop asking so many questions". A) Given support and reassurance and allowed to stay in his room, although encouraged to come out to participate. EKG was done and results given to Dr. Pete PeltHasda.  R) Denies SI and HI.

## 2016-08-06 NOTE — [facility] Group Notes (Addendum)
Adult Therapy Group Note  Date: 08/06/2016  Time:  10:00-11:00AM  Group Topic/Focus: Healthy Press photographerupport Systems   Building Self Esteem:   The focus of this group was to assist patients in identifying their current healthy supports as well as a healthy coping skill they would like to learn and what support could assist them in doing so.  Definitions of support were given to broaden the concept of what support can mean, and that it can include things like transportation, doctors, therapy, and such, not just family/friends.  There was a brief exploration of aromatherapy, music therapy, square breathing, and a 3-minute mindfulness practice.   Participation Level:  Active  Participation Quality:  Attentive and Sharing  Affect:  Blunted and Depressed  Cognitive:  Appropriate  Insight: Good  Engagement in Group:  Engaged  Modes of Intervention:  Activity, Discussion and Support  Additional Comments:  The patient expressed that a current health support is her brother, friends and her car.  She wanted to learn meditation and yoga, and said she got "a little" out of the exercises we did today.  Lynnell ChadMareida J Grossman-Orr, LCSW 08/06/2016  12:45 PM

## 2016-08-06 NOTE — Progress Notes (Signed)
D   Pt complains of a headache    She has been sitting in the dayroom playing cards with her peers    Her behaviors are appropriate   She endorses depression and anxiety A     Verbal support given   Medications administered and effectiveness monitored    Q 15 min checks  R     Pt is safe at present

## 2016-08-06 NOTE — Progress Notes (Signed)
Adult Psychoeducational Group Note  Date:  08/06/2016 Time:  9:21 PM  Group Topic/Focus:  Wrap-Up Group:   The focus of this group is to help patients review their daily goal of treatment and discuss progress on daily workbooks.   Participation Level:  Active  Participation Quality:  Appropriate  Affect:  Appropriate  Cognitive:  Alert, Appropriate and Oriented  Insight: Appropriate  Engagement in Group:  Engaged  Modes of Intervention:  Discussion  Additional Comments:  Patient attended wrap-up group and said that her day was a 4 because she had a migraine.  Her goal for the has was to get rid of the migraine. Her coping skill for the day was to sleep.   [place]ane[place] Spees W Kylan Liberati 08/06/2016, 9:21 PM

## 2016-08-06 NOTE — Progress Notes (Signed)
[facility] MD Progress Note  08/06/2016 2:13 PM Nicole Rangel  MRN:  [id number] Subjective:  "I was hoping to go home today because the lady that checks on my dogs only goes 3 times per day. I do feel a lot better."  Objective: Pt seen and chart reviewed. Pt is alert/oriented x4, calm, cooperative, and appropriate to situation. Pt denies suicidal/homicidal ideation and psychosis and does not appear to be responding to internal stimuli. Pt reports that she continues to feel better; denies any adverse reaction to the new Abilify stating that she has taken it before. Had a lengthy discussion with pt about Qtc intervals, medication management, and risk/benefit ratio for her current regimen. Pt satisfied with current regimen and is understanding that we cannot discharge her today due to her overdose upon arrival but that we can consider discharge Monday/Tuesday range as she does have a job on Wednesday.   Principal Problem: Bipolar affective disorder, currently depressed, moderate (HCC)  Diagnosis:   Patient Active Problem List   Diagnosis Date Noted  . Bipolar affective disorder, currently depressed, moderate (HCC) [F31.32] 08/02/2016    Priority: High  . Bipolar 1 disorder, depressed, severe (HCC) [F31.4] 08/02/2016  . Morbid (severe) obesity due to excess calories (HCC) [E66.01] 03/19/2016  . Borderline diabetes mellitus [R73.03] 03/19/2016  . Acquired hypothyroidism [E03.9] 12/28/2015  . Headache(784.0) [R51] 03/23/2014  . Cephalalgia [R51] 03/23/2014  . Adult ADHD [F90.9] 03/11/2013  . Generalized anxiety disorder [F41.1] 03/11/2013  . Dyslipidemia [E78.5] 03/11/2013  . Bipolar disorder, unspecified [F31.9] 03/11/2013  . Urinary incontinence [R32] 03/11/2013  . Adult attention deficit disorder [F98.8] 03/11/2013  . Bipolar affective disorder (HCC) [F31.9] 03/11/2013   Total Time spent with patient: 25 minutes  Past Psychiatric History: see admission assessment  Past Medical History:   Past Medical History:  Diagnosis Date  . ADHD (attention deficit hyperactivity disorder)   . Allergy   . Anxiety   . Asthma   . Bipolar 1 disorder (HCC)   . Depression   . Headache   . Mood disorder (HCC)   . Panic disorder    History reviewed. No pertinent surgical history. Family History:  Family History  Problem Relation Age of Onset  . Diabetes Mother    Family Psychiatric  History: see admission assessment Social History:  History  Alcohol Use  . 0.0 oz/week    Comment: occasional     History  Drug Use No    Social History   Social History  . Marital status: Single    Spouse name: N/A  . Number of children: 1  . Years of education: BS   Occupational History  . N/A    Social History Main Topics  . Smoking status: Never Smoker  . Smokeless tobacco: Never Used  . Alcohol use 0.0 oz/week     Comment: occasional  . Drug use: No  . Sexual activity: Not Asked   Other Topics Concern  . None   Social History Narrative   Patient is single with 1 child   Patient is right handed   Patient's education level is Bachelor's degree   Patient doesn't drink caffeine, only caffeine free   Additional Social History:    Pain Medications: See MAR Prescriptions: See MAR  Over the Counter: See MAR History of alcohol / drug use?: No history of alcohol / drug abuse                    Sleep: Good  Appetite:  Good  Current Medications: Current Facility-Administered Medications  Medication Dose Route Frequency Provider Last Rate Last Dose  . acetaminophen (TYLENOL) tablet 325 mg  325 mg Oral Q6H PRN Beau FannyJohn C Ervan Heber, FNP      . alum & mag hydroxide-simeth (MAALOX/MYLANTA) 200-200-20 MG/5ML suspension 30 mL  30 mL Oral PRN Charm RingsJamison Y Lord, NP      . ARIPiprazole (ABILIFY) tablet 2 mg  2 mg Oral Daily Beau FannyJohn C Tiago Humphrey, FNP      . butalbital-acetaminophen-caffeine (FIORICET, ESGIC) 50-325-40 MG per tablet 1 tablet  1 tablet Oral Q6H PRN Beau FannyJohn C Antony Sian, FNP      .  cholecalciferol (VITAMIN D) tablet 1,000 Units  1,000 Units Oral Daily Charm RingsJamison Y Lord, NP   1,000 Units at 08/06/16 0949  . ferrous sulfate tablet 325 mg  325 mg Oral Daily Molinda BailiffUreh N Lekwauwa, MD   325 mg at 08/06/16 0949  . hydrOXYzine (ATARAX/VISTARIL) tablet 25 mg  25 mg Oral Q6H PRN Molinda BailiffUreh N Lekwauwa, MD   25 mg at 08/05/16 2207  . ibuprofen (ADVIL,MOTRIN) tablet 600 mg  600 mg Oral Q8H PRN Charm RingsJamison Y Lord, NP   600 mg at 08/05/16 1819  . levothyroxine (SYNTHROID, LEVOTHROID) tablet 100 mcg  100 mcg Oral QAC breakfast Charm RingsJamison Y Lord, NP   100 mcg at 08/06/16 [id number]  . magnesium hydroxide (MILK OF MAGNESIA) suspension 30 mL  30 mL Oral Daily PRN Charm RingsJamison Y Lord, NP      . nicotine (NICODERM CQ - dosed in mg/24 hours) patch 21 mg  21 mg Transdermal Daily Charm RingsJamison Y Lord, NP      . ondansetron [facility](ZOFRAN) tablet 4 mg  4 mg Oral Q8H PRN Charm RingsJamison Y Lord, NP      . sertraline (ZOLOFT) tablet 200 mg  200 mg Oral Daily Molinda BailiffUreh N Lekwau[place], MD   200 mg at 08/06/16 0949  . traZODone (DESYREL) tablet 200 mg  200 mg Oral QHS Molinda BailiffUreh N Lekwauwa, MD   200 mg at 08/05/16 2206    Lab Results:  Results for orders placed or performed during the hospital encounter of 08/02/16 (from the past 48 hour(s))  Lipid panel     Status: Abnormal   Collection Time: 08/06/16  6:38 AM  Result Value Ref Range   Cholesterol 214 (H) 0 - 200 mg/dL   Triglycerides 97 <119<150 mg/dL   HDL 49 >14>40 mg/dL   Total CHOL/HDL Ratio 4.4 RATIO   VLDL 19 0 - 40 mg/dL   LDL Cholesterol 782146 (H) 0 - 99 mg/dL    Comment:        Total Cholesterol/HDL:CHD Risk Coronary Heart Disease Risk Table                     Men   Women  1/2 Average Risk   3.4   3.3  Average Risk       5.0   4.4  2 X Average Risk   9.6   7.1  3 X Average Risk  23.4   11.0        Use the calculated Patient Ratio above and the CHD Risk Table to determine the patient's CHD Risk.        ATP III CLASSIFICATION (LDL):  <100     mg/dL   Optimal  [id number]  mg/dL   Near or Above                     Optimal  130-159  mg/dL   Borderline  161-096  mg/dL   High  >045     mg/dL   Very High Performed at [facility]     Blood Alcohol level:  Lab Results  Component Value Date   [facility] <5 08/01/2016    Metabolic Disorder Labs: Lab Results  Component Value Date   HGBA1C 5.5 03/11/2013   MPG 111 03/11/2013   No results found for: PROLACTIN Lab Results  Component Value Date   CHOL 214 (H) 08/06/2016   TRIG 97 08/06/2016   HDL 49 08/06/2016   CHOLHDL 4.4 08/06/2016   VLDL 19 08/06/2016   LDLCALC 146 (H) 08/06/2016   LDLCALC 137 (H) 03/11/2013    Physical Findings: AIMS: Facial and Oral Movements Muscles of Facial Expression: None, normal Lips and Perioral Area: None, normal Jaw: None, normal Tongue: None, normal,Extremity Movements Upper (arms, wrists, hands, fingers): None, normal Lower (legs, knees, ankles, toes): None, normal, Trunk Movements Neck, shoulders, hips: None, normal, Overall Severity Severity of abnormal movements (highest score from questions above): None, normal Incapacitation due to abnormal movements: None, normal Patient's awareness of abnormal movements (rate only patient's report): No Awareness, Dental Status Current problems with teeth and/or dentures?: No Does patient usually wear dentures?: No  CIWA:    COWS:     Musculoskeletal: Strength & Muscle Tone: within normal limits Gait & Station: normal Patient leans: N/A  Psychiatric Specialty Exam: Physical Exam  Constitutional: She appears well-developed and well-nourished.    Review of Systems  Psychiatric/Behavioral: Positive for depression. Negative for hallucinations and suicidal ideas. The patient is nervous/anxious and has insomnia.   All other systems reviewed and are negative.   Blood pressure (!) 102/47, pulse (!) 124, temperature 98.6 F (37 C), temperature source Oral, resp. rate 18, height 5\' 6"  (1.676 m), weight 105.7 kg (233 lb), SpO2 98 %.Body mass index is 37.61  kg/m.  General Appearance: Casual and Fairly Groomed  Eye Contact:  Good  Speech:  Clear and Coherent and Normal Rate  Volume:  Normal  Mood:  Anxious and Depressed and greatly improving   Affect:  Congruent  Thought Process:  Coherent and Goal Directed  Orientation:  Full (Time, Place, and Person)  Thought Content:  Symptoms, worries, concerns  Suicidal Thoughts:  No and is able to contract for safety  Homicidal Thoughts:  No  Memory:  Immediate;   Fair Recent;   Fair Remote;   Fair  Judgement:  Fair  Insight:  Fair  Psychomotor Activity:  Normal  Concentration:  Concentration: Fair and Attention Span: Fair  Recall:  [employer] of Knowledge:  Fair  Language:  Fair  Akathisia:  No  Handed:  Right  AIMS (if indicated):     Assets:  Communication Skills Desire for Improvement Financial Resources/Insurance Housing Physical Health Social Support Transportation  ADL's:  Intact  Cognition:  WNL  Sleep:  Number of Hours: 5.5   Treatment Plan Summary: Bipolar affective disorder, currently depressed, moderate (HCC) unstable, yet improving, managed as below:   Medications/Tests:  -Continue Zoloft 200mg  daily for depressive symptoms -New EKG reviewed and mid 400's, greatly improved -Ordered TSH and T4 free per pt request (hasn't been checked in very long time per pt report; last result here 2014) -Continue Start Abilify 2mg  po daily for mood stabilization and manic phase of Bipolar  -Continue home Ferrous 325mg  daily -Continue Vistaril 25mg  po q6h prn anxiety -Continue home Synthroid   Daily contact with patient to  assess and evaluate symptoms and progress in treatment and Medication management  Beau FannyWithrow, Dawud Mays C, FNP 08/06/2016, 2:13 PM

## 2016-08-06 NOTE — [facility] Group Notes (Signed)
[facility] Group Notes:  (Nursing/MHT/Case Management/Adjunct)  Date:  08/06/2016  Time:  10:40 AM  Type of Therapy:  Psychoeducational Skills  Participation Level:  Did Not Attend  Participation Quality:  Did Not Attend  Affect:  Did Not Attend  Cognitive:  Did Not Attend  Insight:  None  Engagement in Group:  Did Not Attend  Modes of Intervention:  Did Not Attend  Summary of Progress/Problems: Pt did not attend patient self inventory group.   Tima Curet Shanta 08/06/2016, 10:40 AM 

## 2016-08-07 LAB — PROLACTIN: Prolactin: 21.2 ng/mL (ref 4.8–23.3)

## 2016-08-07 LAB — HEMOGLOBIN A1C
HEMOGLOBIN A1C: 6.1 % — AB (ref 4.8–5.6)
MEAN PLASMA GLUCOSE: 128 mg/dL

## 2016-08-07 LAB — T4, FREE: FREE T4: 1 ng/dL (ref 0.61–1.12)

## 2016-08-07 MED ORDER — DIVALPROEX SODIUM ER 250 MG PO TB24
250.0000 mg | ORAL_TABLET | Freq: Every day | ORAL | Status: DC
  Administered 2016-08-07: 250 mg via ORAL
  Filled 2016-08-07 (×3): qty 1

## 2016-08-07 MED ORDER — TRAZODONE HCL 100 MG PO TABS
100.0000 mg | ORAL_TABLET | Freq: Every evening | ORAL | Status: DC | PRN
  Administered 2016-08-07: 100 mg via ORAL
  Filled 2016-08-07: qty 1
  Filled 2016-08-07: qty 10

## 2016-08-07 MED ORDER — TRAZODONE HCL 100 MG PO TABS: 100.0000 mg | ORAL_TABLET | Freq: Every day | ORAL | Status: DC

## 2016-08-07 NOTE — [facility] Group Notes (Signed)
[facility] LCSW Group Therapy 08/07/2016  1:15 pm  Type of Therapy: Group Therapy Participation Level: Active  Participation Quality: Attentive, Sharing and Supportive  Affect: Appropriate  Cognitive: Alert and Oriented  Insight: Developing/Improving and Engaged  Engagement in Therapy: Developing/Improving and Engaged  Modes of Intervention: Clarification, Confrontation, Discussion, Education, Exploration,  Limit-setting, Orientation, Problem-solving, Rapport Building, Dance movement psychotherapisteality Testing, Socialization and Support  Summary of Progress/Problems: Pt identified obstacles faced currently and processed barriers involved in overcoming these obstacles. Pt identified steps necessary for overcoming these obstacles and explored motivation (internal and external) for facing these difficulties head on. Pt further identified one area of concern in their lives and chose a goal to focus on for today. Patient expressed having financial challenges, as well as having difficulty emotioanlly connecting to other people possibly due to having experienced several significant losses in her life.   Samuella BruinKristin Lejla Moeser, LCSW Clinical Social Worker [facility] [phone]

## 2016-08-07 NOTE — Progress Notes (Signed)
Recreation Therapy Notes  Date: 08/07/16 Time: 0930 Location: 300 Hall Dayroom  Group Topic: Stress Management  Goal Area(s) Addresses:  Patient will verbalize importance of using healthy stress management.  Patient will identify positive emotions associated with healthy stress management.   Intervention: Calm App  Activity :  Body Scan Meditation.  LRT introduced the stress management technique of meditation.  LRT played a body scan meditation from the Calm app to allow the patients to engage in the activity.  Patients were to follow along as the meditation was being played.  Education:  Stress Management, Discharge Planning.   Education Outcome: Acknowledges edcuation/In group clarification offered/Needs additional education  Clinical Observations/Feedback: Pt did not attend group.    Caroll RancherMarjette Devine Dant, LRT/CTRS         Caroll RancherLindsay, Patrich Heinze A 08/07/2016 12:20 PM

## 2016-08-07 NOTE — Tx Team (Signed)
Interdisciplinary Treatment and Diagnostic Plan Update  08/07/2016 Time of Session: 9:30 AM Nicole Rangel MRN: [id number]  Principal Diagnosis: Bipolar 1 Disorder  Secondary Diagnoses: Principal Problem:   Bipolar affective disorder, currently depressed, moderate (HCC)   Current Medications:  Current Facility-Administered Medications  Medication Dose Route Frequency Provider Last Rate Last Dose  . acetaminophen (TYLENOL) tablet 325 mg  325 mg Oral Q6H PRN Beau Fanny, FNP      . alum & mag hydroxide-simeth (MAALOX/MYLANTA) 200-200-20 MG/5ML suspension 30 mL  30 mL Oral PRN Charm Rings, NP      . butalbital-acetaminophen-caffeine (FIORICET, ESGIC) [phone] MG per tablet 1 tablet  1 tablet Oral Q6H PRN Beau Fanny, FNP   1 tablet at 08/07/16 1320  . cholecalciferol (VITAMIN D) tablet 1,000 Units  1,000 Units Oral Daily Charm Rings, NP   1,000 Units at 08/07/16 0815  . divalproex (DEPAKOTE ER) 24 hr tablet 250 mg  250 mg Oral Daily Rockey Situ Cobos, MD   250 mg at 08/07/16 1318  . ferrous sulfate tablet 325 mg  325 mg Oral Daily Molinda Bailiff, MD   325 mg at 08/07/16 0634  . hydrOXYzine (ATARAX/VISTARIL) tablet 25 mg  25 mg Oral Q6H PRN Molinda Bailiff, MD   25 mg at 08/06/16 2218  . ibuprofen (ADVIL,MOTRIN) tablet 600 mg  600 mg Oral Q8H PRN Charm Rings, NP   600 mg at 08/06/16 2010  . levothyroxine (SYNTHROID, LEVOTHROID) tablet 100 mcg  100 mcg Oral QAC breakfast Charm Rings, NP   100 mcg at 08/07/16 1478  . magnesium hydroxide (MILK OF MAGNESIA) suspension 30 mL  30 mL Oral Daily PRN Charm Rings, NP      . nicotine (NICODERM CQ - dosed in mg/24 hours) patch 21 mg  21 mg Transdermal Daily Charm Rings, NP      . ondansetron [facility]) tablet 4 mg  4 mg Oral Q8H PRN Charm Rings, NP      . sertraline (ZOLOFT) tablet 200 mg  200 mg Oral Daily Molinda Bailiff, MD   200 mg at 08/07/16 0815  . traZODone (DESYREL) tablet 100 mg  100 mg Oral QHS PRN Craige Cotta,  MD       PTA Medications: Prescriptions Prior to Admission  Medication Sig Dispense Refill Last Dose  . cholecalciferol (VITAMIN D) 1000 UNITS tablet Take 1,000 Units by mouth daily.   07/31/2016 at Unknown time  . ferrous sulfate 325 (65 FE) MG tablet Take by mouth.   07/31/2016 at Unknown time  . ibuprofen (ADVIL,MOTRIN) 200 MG tablet Take 800 mg by mouth every 6 (six) hours as needed for moderate pain.   08/01/2016 at Unknown time  . levothyroxine (SYNTHROID, LEVOTHROID) 100 MCG tablet Take 1 tablet by mouth daily.   08/01/2016 at Unknown time  . phentermine (ADIPEX-P) 37.5 MG tablet Take 1 tablet by mouth daily.  1 08/01/2016 at Unknown time  . sertraline (ZOLOFT) 100 MG tablet Take 200 mg by mouth daily.    08/01/2016 at Unknown time  . traZODone (DESYREL) 100 MG tablet Take 100 mg by mouth at bedtime.    08/01/2016 at Unknown time  . ziprasidone (GEODON) 20 MG capsule Take 40 mg by mouth daily.    07/31/2016 at Unknown time    Patient Stressors: Financial difficulties Occupational concerns  Patient Strengths: Capable of independent living Wellsite geologist fund of knowledge Motivation for treatment/growth  Treatment Modalities:  Medication Management, Group therapy, Case management,  1 to 1 session with clinician, Psychoeducation, Recreational therapy.   Physician Treatment Plan for Primary Diagnosis: Bipolar affective disorder, currently depressed, moderate (HCC) Long Term Goal(s): Improvement in symptoms so as ready for discharge Improvement in symptoms so as ready for discharge   Short Term Goals: Ability to identify changes in lifestyle to reduce recurrence of condition will improve Ability to verbalize feelings will improve Ability to disclose and discuss suicidal ideas Ability to demonstrate self-control will improve Ability to identify and develop effective coping behaviors will improve Ability to maintain clinical measurements within normal limits will  improve Compliance with prescribed medications will improve Ability to identify triggers associated with substance abuse/mental health issues will improve Ability to identify changes in lifestyle to reduce recurrence of condition will improve Ability to verbalize feelings will improve Ability to disclose and discuss suicidal ideas Ability to demonstrate self-control will improve Ability to identify and develop effective coping behaviors will improve Ability to maintain clinical measurements within normal limits will improve Compliance with prescribed medications will improve Ability to identify triggers associated with substance abuse/mental health issues will improve  Medication Management: Evaluate patient's response, side effects, and tolerance of medication regimen.  Therapeutic Interventions: 1 to 1 sessions, Unit Group sessions and Medication administration.  Evaluation of Outcomes: Adequate for Discharge  Physician Treatment Plan for Secondary Diagnosis: Principal Problem:   Bipolar affective disorder, currently depressed, moderate (HCC)  Long Term Goal(s): Improvement in symptoms so as ready for discharge Improvement in symptoms so as ready for discharge   Short Term Goals: Ability to identify changes in lifestyle to reduce recurrence of condition will improve Ability to verbalize feelings will improve Ability to disclose and discuss suicidal ideas Ability to demonstrate self-control will improve Ability to identify and develop effective coping behaviors will improve Ability to maintain clinical measurements within normal limits will improve Compliance with prescribed medications will improve Ability to identify triggers associated with substance abuse/mental health issues will improve Ability to identify changes in lifestyle to reduce recurrence of condition will improve Ability to verbalize feelings will improve Ability to disclose and discuss suicidal ideas Ability to  demonstrate self-control will improve Ability to identify and develop effective coping behaviors will improve Ability to maintain clinical measurements within normal limits will improve Compliance with prescribed medications will improve Ability to identify triggers associated with substance abuse/mental health issues will improve     Medication Management: Evaluate patient's response, side effects, and tolerance of medication regimen.  Therapeutic Interventions: 1 to 1 sessions, Unit Group sessions and Medication administration.  Evaluation of Outcomes: Adequate for Discharge   RN Treatment Plan for Primary Diagnosis: Bipolar 1 Disorder  Long Term Goal(s): Knowledge of disease and therapeutic regimen to maintain health will improve  Short Term Goals: Ability to remain free from injury will improve and Ability to disclose and discuss suicidal ideas  Medication Management: RN will administer medications as ordered by provider, will assess and evaluate patient's response and provide education to patient for prescribed medication. RN will report any adverse and/or side effects to prescribing provider.  Therapeutic Interventions: 1 on 1 counseling sessions, Psychoeducation, Medication administration, Evaluate responses to treatment, Monitor vital signs and CBGs as ordered, Perform/monitor CIWA, COWS, AIMS and Fall Risk screenings as ordered, Perform wound care treatments as ordered.  Evaluation of Outcomes: Adequate for Discharge   LCSW Treatment Plan for Primary Diagnosis: Bipolar 1 Disorder  Long Term Goal(s): Safe transition to appropriate next level of care at  discharge, Engage patient in therapeutic group addressing interpersonal concerns.  Short Term Goals: Engage patient in aftercare planning with referrals and resources, Increase ability to appropriately verbalize feelings, Identify triggers associated with mental health/substance abuse issues and Increase skills for wellness and  recovery  Therapeutic Interventions: Assess for all discharge needs, 1 to 1 time with Social worker, Explore available resources and support systems, Assess for adequacy in community support network, Educate family and significant other(s) on suicide prevention, Complete Psychosocial Assessment, Interpersonal group therapy.  Evaluation of Outcomes: Adequate for Discharge   Progress in Treatment: Attending groups: Yes. Participating in groups: Yes. Taking medication as prescribed: Yes. Toleration medication: Yes. Family/Significant other contact made: No, will contact:  collateral w patient consent, pt declining consent at present Patient understands diagnosis: Yes. Discussing patient identified problems/goals with staff: Yes. Medical problems stabilized or resolved: Yes. Denies suicidal/homicidal ideation: Yes, denies Issues/concerns per patient self-inventory: No. Other: na  New problem(s) identified: No, Describe:  none at this time  New Short Term/Long Term Goal(s):  Discharge Plan or Barriers: Patient plans to return home to follow up with outpatient providers  Reason for Continuation of Hospitalization: Depression Medication stabilization Suicidal ideation  Estimated Length of Stay: Discharge anticipated for tomorrow 08/08/16  Attendees: Patient: 08/07/2016 10:14 AM  Physician: Dr. Mckinley Jewelates, Dr. Jama Flavorsobos MD 08/07/2016 10:14 AM  Nursing: Quintella ReichertBeverly Knight, RN 08/07/2016 10:14 AM  RN Care Manager: Onnie BoerJennifer Clark CM 08/07/2016 10:14 AM  Social Worker: Herbert SetaHeather Smart, Derek MoundLauren Carter Newton,  LCSW 08/07/2016 10:14 AM  Recreational Therapist:  08/07/2016 10:14 AM  Other: Baxter Kailonrad Withrow, Tanika Lewis, NP 08/07/2016 10:14 AM  Other:  08/07/2016 10:14 AM  Other: 08/07/2016 10:14 AM    Scribe for Treatment Team: West CarboKristin L Amylee Lodato, LCSW 08/07/2016

## 2016-08-07 NOTE — Progress Notes (Signed)
[facility]  08/07/2016 2:08 PM Nicole Rangel  MRN:  [id number] Subjective:  Patient states she is feeling better than on admission . Reports some lingering depression but overall feeling better than prior to admission . Denies medication side effects at this time, but worries about Abilify side effects.  Objective: Pt seen and chart reviewed.  Patient is a 35 year old female, with a history of Bipolar Disorder, who was admitted on 10/26 for  Worsening depression, and an overdose on Trazodone and Xanax, which she states was impulsive . Unemployment and financial difficulties have been a contributor to her current  Depression. Of note, admission EKG remarkable for QTc prolongation, which had improved at follow up EKG on 10/28. Patient reports improving mood and feeling better overall, although still somewhat sad and ruminative about her stressors. She does state she feels hopeful and more optimistic. Denies suicidal ideations . She is visible on unit, going to some groups. We reviewed medications, side effects. At this time patient feeling concerned about atypical antipsychotic and possible side effects. States she had been on Depakote in the past, and does not remember having had any side effects, prefers to try Depakote again .We reviewed side effect profile and the importance of periodic blood draws to correctly monitor Valproic Acid Management . Labs reviewed - TSH WNL. HgbA1C 6.1 , mild hypercholesterolemia    Principal Problem: Bipolar affective disorder, currently depressed, moderate (HCC)  Diagnosis:   Patient Active Problem List   Diagnosis Date Noted  . Bipolar 1 disorder, depressed, severe (HCC) [F31.4] 08/02/2016  . Bipolar affective disorder, currently depressed, moderate (HCC) [F31.32] 08/02/2016  . Morbid (severe) obesity due to excess calories (HCC) [E66.01] 03/19/2016  . Borderline diabetes mellitus [R73.03] 03/19/2016  . Acquired hypothyroidism [E03.9] 12/28/2015   . Headache(784.0) [R51] 03/23/2014  . Cephalalgia [R51] 03/23/2014  . Adult ADHD [F90.9] 03/11/2013  . Generalized anxiety disorder [F41.1] 03/11/2013  . Dyslipidemia [E78.5] 03/11/2013  . Bipolar disorder, unspecified [F31.9] 03/11/2013  . Urinary incontinence [R32] 03/11/2013  . Adult attention deficit disorder [F98.8] 03/11/2013  . Bipolar affective disorder (HCC) [F31.9] 03/11/2013   Total Time spent with patient: 25 minutes  Past Psychiatric History: see admission assessment  Past Medical History:  Past Medical History:  Diagnosis Date  . ADHD (attention deficit hyperactivity disorder)   . Allergy   . Anxiety   . Asthma   . Bipolar 1 disorder (HCC)   . Depression   . Headache   . Mood disorder (HCC)   . Panic disorder    History reviewed. No pertinent surgical history. Family History:  Family History  Problem Relation Age of Onset  . Diabetes Mother    Family Psychiatric  History: see admission assessment Social History:  History  Alcohol Use  . 0.0 oz/week    Comment: occasional     History  Drug Use No    Social History   Social History  . Marital status: Single    Spouse name: N/A  . Number of children: 1  . Years of education: BS   Occupational History  . N/A    Social History Main Topics  . Smoking status: Never Smoker  . Smokeless tobacco: Never Used  . Alcohol use 0.0 oz/week     Comment: occasional  . Drug use: No  . Sexual activity: Not Asked   Other Topics Concern  . None   Social History Narrative   Patient is single with 1 child   Patient  is right handed   Patient's education level is Bachelor's degree   Patient doesn't drink caffeine, only caffeine free   Additional Social History:    Pain Medications: See MAR Prescriptions: See MAR  Over the Counter: See MAR History of alcohol / drug use?: No history of alcohol / drug abuse  Sleep: Good  Appetite:  Good  Current Medications: Current Facility-Administered  Medications  Medication Dose Route Frequency Provider Last Rate Last Dose  . acetaminophen (TYLENOL) tablet 325 mg  325 mg Oral Q6H PRN Beau Fanny, FNP      . alum & mag hydroxide-simeth (MAALOX/MYLANTA) 200-200-20 MG/5ML suspension 30 mL  30 mL Oral PRN Charm Rings, NP      . butalbital-acetaminophen-caffeine (FIORICET, ESGIC) [phone] MG per tablet 1 tablet  1 tablet Oral Q6H PRN Beau Fanny, FNP   1 tablet at 08/07/16 1320  . cholecalciferol (VITAMIN D) tablet 1,000 Units  1,000 Units Oral Daily Charm Rings, NP   1,000 Units at 08/07/16 0815  . divalproex (DEPAKOTE ER) 24 hr tablet 250 mg  250 mg Oral Daily Rockey Situ Junell Cullifer, MD   250 mg at 08/07/16 1318  . ferrous sulfate tablet 325 mg  325 mg Oral Daily Molinda Bailiff, MD   325 mg at 08/07/16 0634  . hydrOXYzine (ATARAX/VISTARIL) tablet 25 mg  25 mg Oral Q6H PRN Molinda Bailiff, MD   25 mg at 08/06/16 2218  . ibuprofen (ADVIL,MOTRIN) tablet 600 mg  600 mg Oral Q8H PRN Charm Rings, NP   600 mg at 08/06/16 2010  . levothyroxine (SYNTHROID, LEVOTHROID) tablet 100 mcg  100 mcg Oral QAC breakfast Charm Rings, NP   100 mcg at 08/07/16 1478  . magnesium hydroxide (MILK OF MAGNESIA) suspension 30 mL  30 mL Oral Daily PRN Charm Rings, NP      . nicotine (NICODERM CQ - dosed in mg/24 hours) patch 21 mg  21 mg Transdermal Daily Charm Rings, NP      . ondansetron [facility]) tablet 4 mg  4 mg Oral Q8H PRN Charm Rings, NP      . sertraline (ZOLOFT) tablet 200 mg  200 mg Oral Daily Molinda Bailiff, MD   200 mg at 08/07/16 0815  . traZODone (DESYREL) tablet 200 mg  200 mg Oral QHS Molinda Bailiff, MD   200 mg at 08/06/16 2217    Lab Results:  Results for orders placed or performed during the hospital encounter of 08/02/16 (from the past 48 hour(s))  Lipid panel     Status: Abnormal   Collection Time: 08/06/16  6:38 AM  Result Value Ref Range   Cholesterol 214 (H) 0 - 200 mg/dL   Triglycerides 97 <295 mg/dL   HDL 49 >62 mg/dL    Total CHOL/HDL Ratio 4.4 RATIO   VLDL 19 0 - 40 mg/dL   LDL Cholesterol 130 (H) 0 - 99 mg/dL    Comment:        Total Cholesterol/HDL:CHD Risk Coronary Heart Disease Risk Table                     Men   Women  1/2 Average Risk   3.4   3.3  Average Risk       5.0   4.4  2 X Average Risk   9.6   7.1  3 X Average Risk  23.4   11.0  Use the calculated Patient Ratio above and the CHD Risk Table to determine the patient's CHD Risk.        ATP III CLASSIFICATION (LDL):  <100     mg/dL   Optimal  161-096100-129  mg/dL   Near or Above                    Optimal  130-159  mg/dL   Borderline  [id number]  mg/dL   High  >811>190     mg/dL   Very High Performed at [facility]   Prolactin     Status: None   Collection Time: 08/06/16  6:38 AM  Result Value Ref Range   Prolactin 21.2 4.8 - 23.3 ng/mL    Comment: (NOTE) Performed At: [facility] [address] [place], [place] [id number] Mila HomerHancock William F MD OZ:[id number]:[phone] Performed at [facility]   Hemoglobin A1c     Status: Abnormal   Collection Time: 08/06/16  6:38 AM  Result Value Ref Range   Hgb A1c MFr Bld 6.1 (H) 4.8 - 5.6 %    Comment: (NOTE)         Pre-diabetes: 5.7 - 6.4         Diabetes: >6.4         Glycemic control for adults with diabetes: <7.0    Mean Plasma Glucose 128 mg/dL    Comment: (NOTE) Performed At: [facility] [address] [place], [place] [id number] Mila HomerHancock William F MD KG:[id number]:[phone] Performed at [facility]   TSH     Status: None   Collection Time: 08/06/16  6:38 PM  Result Value Ref Range   TSH 0.875 0.350 - 4.500 uIU/mL    Comment: Performed by a 3rd Generation assay with a functional sensitivity of <=0.01 uIU/mL. Performed at [facility]   T4, free     Status: None   Collection Time: 08/06/16  6:38 PM  Result Value Ref Range   Free T4 1.00 0.61 - 1.12 ng/dL    Comment: (NOTE) Biotin ingestion may interfere with free  T4 tests. If the results are inconsistent with the TSH level, previous test results, or the clinical presentation, then consider biotin interference. If needed, order repeat testing after stopping biotin. Performed at [facility]     Blood Alcohol level:  Lab Results  Component Value Date   [facility] <5 08/01/2016    Metabolic Disorder Labs: Lab Results  Component Value Date   HGBA1C 6.1 (H) 08/06/2016   MPG 128 08/06/2016   MPG 111 03/11/2013   Lab Results  Component Value Date   PROLACTIN 21.2 08/06/2016   Lab Results  Component Value Date   CHOL 214 (H) 08/06/2016   TRIG 97 08/06/2016   HDL 49 08/06/2016   CHOLHDL 4.4 08/06/2016   VLDL 19 08/06/2016   LDLCALC 146 (H) 08/06/2016   LDLCALC 137 (H) 03/11/2013    Physical Findings: AIMS: Facial and Oral Movements Muscles of Facial Expression: None, normal Lips and Perioral Area: None, normal Jaw: None, normal Tongue: None, normal,Extremity Movements Upper (arms, wrists, hands, fingers): None, normal Lower (legs, knees, ankles, toes): None, normal, Trunk Movements Neck, shoulders, hips: None, normal, Overall Severity Severity of abnormal movements (highest score from questions above): None, normal Incapacitation due to abnormal movements: None, normal Patient's awareness of abnormal movements (rate only patient's report): No Awareness, Dental Status Current problems with teeth and/or dentures?: No Does patient usually wear dentures?: No  CIWA:    COWS:     Musculoskeletal: Strength & Muscle Tone: within normal limits Gait & Station: normal Patient leans: N/A  Psychiatric Specialty Exam: Physical Exam  Constitutional: She appears well-developed and well-nourished.    Review of Systems  Psychiatric/Behavioral: Positive for depression. Negative for hallucinations and suicidal ideas. The patient is nervous/anxious and has insomnia.   All other systems reviewed and are negative.   Blood pressure 114/71,  pulse 89, temperature 98 F (36.7 C), temperature source Oral, resp. rate 16, height 5\' 6"  (1.676 m), weight 233 lb (105.7 kg), SpO2 98 %.Body mass index is 37.61 kg/m.  General Appearance: Fairly Groomed  Eye Contact:  Good  Speech:  Normal Rate  Volume:  Normal  Mood:  Improving mood, states feeling better than on admission  Affect:  Mildly constricted, anxious, but reactive, and does smile at times appropriately   Thought Process:  Goal Directed  Orientation:  Full (Time, Place, and Person)  Thought Content:  Denies hallucinations, no delusions   Suicidal Thoughts:  No  Denies any current suicidal or self injurious ideations, denies homicidal or violent ideations   Homicidal Thoughts:  No  Memory: recent and remote grossly intact   Judgement:  Improving   Insight:  Improving   Psychomotor Activity:  Normal  Concentration:  Concentration: Fair and Attention Span: Fair  Recall:  [employer] of Knowledge:  Fair  Language:  Fair  Akathisia:  No  Handed:  Right  AIMS (if indicated):     Assets:  Communication Skills Desire for Improvement Financial Resources/Insurance Housing Physical Health Social Support Transportation  ADL's:  Intact  Cognition:  WNL  Sleep:  Number of Hours: 6   Assessment - Patient reports partial but significant improvement of her depressive episode. She does report a history of Bipolar Disorder and of chronic anxiety . She worries about Abilify side effects, particularly  as class related to atypical antipsychotics and as related to recently elevated QTc and is hoping to change medication. She states Depakote was well tolerated in the past .   Medications/Tests:  -Continue Zoloft 200 mg  QDAY  for depression and anxiety  -Recheck EKG to monitor QTc  -Start Depakote ER 250 mgrs QDAY initially for Mood Disorder  -D/C Abilify -Continue home Synthroid 100 mcg QDAY for Hypothyroidism -Decrease Trazodone to 100 mgrs QHS PRN for insomnia, as needed  - We  reviewed lab results and benefits of lifestyle /diet changes in order to decrease risk of hyperlipidemia  and DM.  -Treatment team working on disposition planning    Daily contact with patient to assess and evaluate symptoms and progress in treatment and Medication management  Nehemiah Massed, MD 08/07/2016, 2:08 PM   Patient ID: Nicole Rangel, female   DOB: [birth year], 35 y.o.   MRN: [id number]

## 2016-08-07 NOTE — Progress Notes (Signed)
Patient denies SI, HI and AVH this shift but reports anxiety and depressed mood.  Patient has been complaining of a migraine headache that has persisted for the last three days and she has found only temporary relief with medications. Patient has been resting in her room due to light sensitivity.   Assess patient for safety, offer medications as prescribed, engage patient in 1:1 staff talks.   Patient able to maintain safety, continue to monitor

## 2016-08-07 NOTE — Progress Notes (Signed)
Patient A/O, no noted distress. Denies pain/SI/HI/AVH. Patient notes "had a good day. "  [facility].  She notes her appetite has decrease. Patient has verbally contracted to safety. Staff will continue to monitor, meet needs, and maintain safety.

## 2016-08-08 MED ORDER — LEVOTHYROXINE SODIUM 100 MCG PO TABS: 100.0000 ug | ORAL_TABLET | Freq: Every day | ORAL | 0 refills | Status: AC

## 2016-08-08 MED ORDER — DIVALPROEX SODIUM ER 500 MG PO TB24: 500.0000 mg | ORAL_TABLET | Freq: Every day | ORAL | Status: DC

## 2016-08-08 MED ORDER — HYDROXYZINE HCL 25 MG PO TABS: 25.0000 mg | ORAL_TABLET | Freq: Four times a day (QID) | ORAL | 0 refills | Status: DC | PRN

## 2016-08-08 MED ORDER — DIVALPROEX SODIUM ER 500 MG PO TB24
500.0000 mg | ORAL_TABLET | Freq: Every day | ORAL | Status: DC
  Filled 2016-08-08 (×2): qty 1

## 2016-08-08 MED ORDER — SERTRALINE HCL 100 MG PO TABS: 200.0000 mg | ORAL_TABLET | Freq: Every day | ORAL | 0 refills | Status: AC

## 2016-08-08 MED ORDER — VITAMIN D3 25 MCG (1000 UNIT) PO TABS: 1000.0000 [IU] | ORAL_TABLET | Freq: Every day | ORAL | 0 refills | Status: AC

## 2016-08-08 MED ORDER — DIVALPROEX SODIUM ER 500 MG PO TB24: 500.0000 mg | ORAL_TABLET | Freq: Every day | ORAL | 0 refills | Status: AC

## 2016-08-08 MED ORDER — TRAZODONE HCL 100 MG PO TABS: 100.0000 mg | ORAL_TABLET | Freq: Every evening | ORAL | 0 refills | Status: AC | PRN

## 2016-08-08 NOTE — Progress Notes (Signed)
Discharge note:  Patient discharged home per MD order.  Patient received all personal belongings from unit and locker.  Patient denies any thoughts of self harm; AVH/HI.  [facility]  Reviewed AVS/transition record with patient and she indicated understanding.  Patient left ambulatory with a friend.

## 2016-08-08 NOTE — Progress Notes (Signed)
Adult Psychoeducational Group Note  Date:  08/08/2016 Time:  12:20 AM  Group Topic/Focus:  Wrap-Up Group:   The focus of this group is to help patients review their daily goal of treatment and discuss progress on daily workbooks.   Participation Level:  Active  Participation Quality:  Appropriate  Affect:  Appropriate  Cognitive:  Appropriate  Insight: Appropriate  Engagement in Group:  Engaged  Modes of Intervention:  Discussion  Additional Comments:  Patient attended wrap-up group and said she had a good day.  When writer asked the patient what she had learned in group today, she replied forgot what was discussed. However, she did remembered the Social worker read a Ship brokerbeautiful poem.  Nicole Rangel 08/08/2016, 12:20 AM

## 2016-08-08 NOTE — [facility] Suicide Risk Assessment (Signed)
[facility]   Principal Problem: Bipolar affective disorder, currently depressed, moderate (HCC) Discharge Diagnoses:  Patient Active Problem List   Diagnosis Date Noted  . Bipolar 1 disorder, depressed, severe (HCC) [F31.4] 08/02/2016  . Bipolar affective disorder, currently depressed, moderate (HCC) [F31.32] 08/02/2016  . Morbid (severe) obesity due to excess calories (HCC) [E66.01] 03/19/2016  . Borderline diabetes mellitus [R73.03] 03/19/2016  . Acquired hypothyroidism [E03.9] 12/28/2015  . Headache(784.0) [R51] 03/23/2014  . Cephalalgia [R51] 03/23/2014  . Adult ADHD [F90.9] 03/11/2013  . Generalized anxiety disorder [F41.1] 03/11/2013  . Dyslipidemia [E78.5] 03/11/2013  . Bipolar disorder, unspecified [F31.9] 03/11/2013  . Urinary incontinence [R32] 03/11/2013  . Adult attention deficit disorder [F98.8] 03/11/2013  . Bipolar affective disorder (HCC) [F31.9] 03/11/2013    Total Time spent with patient: 30 minutes  Musculoskeletal: Strength & Muscle Tone: within normal limits Gait & Station: normal Patient leans: N/A  Psychiatric Specialty Exam: ROS  Blood pressure 115/89, pulse 89, temperature 99.1 F (37.3 C), temperature source Oral, resp. rate 16, height 5\' 6"  (1.676 m), weight 233 lb (105.7 kg), SpO2 98 %.Body mass index is 37.61 kg/m.  General Appearance: Well Groomed  Eye Contact::  Good  Speech:  Normal Rate409  Volume:  Normal  Mood:  improved, currently denies depression  Affect:  Appropriate and Full Range  Thought Process:  Linear  Orientation:  Full (Time, Place, and Person)  Thought Content:  no hallucinations, no delusions, not internally preoccupied   Suicidal Thoughts:  No- denies suicidal or self injurious ideations  Homicidal Thoughts:  No  Memory:  recent and remote grossly intact   Judgement:  Other:  impropving   Insight:  improving   Psychomotor Activity:  Normal  Concentration:  Good  Recall:  Good  Fund of  Knowledge:Good  Language: Good  Akathisia:  Negative  Handed:  Right  AIMS (if indicated):     Assets:  Communication Skills Desire for Improvement Resilience  Sleep:  Number of Hours: 6  Cognition: WNL  ADL's:  Intact   Mental Status Per Nursing Assessment::   On Admission:  NA  Demographic Factors:  35 year old female  Loss Factors: Psychosocial stressors, mainly unemployment   Historical Factors: Has been diagnosed with Bipolar Disorder and with ADHD in the past   Risk Reduction Factors:   Sense of responsibility to family, Living with another person, especially a relative and Positive coping skills or problem solving skills  Continued Clinical Symptoms:  At this time patient is alert, attentive, well related, calm, mood is improved, affect improved and brighter, no thought disorder , denies suicidal or self injurious ideations, denies homicidal ideations,  denies hallucinations, no delusions , not internally preoccupied, future oriented.  Denies medication side effects.  Cognitive Features That Contribute To Risk:  No gross cognitive deficits noted upon discharge. Is alert , attentive, and oriented x 3   Suicide Risk:  Mild:  Suicidal ideation of limited frequency, intensity, duration, and specificity.  There are no identifiable plans, no associated intent, mild dysphoria and related symptoms, good self-control (both objective and subjective assessment), few other risk factors, and identifiable protective factors, including available and accessible social support.  Follow-up Information    [facility]. Go in 3 day(s).   Specialty:  Professional Counselor Why:  Please use [facility] to establish for medications management and therapy; hours are Monday - Friday from 8:30 - 11:30 AM.   Contact information: [address]  [place] [id number] [phone]           Plan Of Care/Follow-up recommendations:  Activity:  as tolerated   Diet:  Regular Tests:  NA Other:  See below  Patient is leaving unit in good spirits . Plans to return home after discharge. Follow up as above   Nehemiah MassedOBOS, Gali Spinney, MD 08/08/2016, 1:56 PM

## 2016-08-08 NOTE — Discharge Summary (Signed)
Physician Discharge Summary Note  Patient:  Nicole Rangel is an 35 y.o., female MRN:  [id number] DOB:  1981/04/28 Patient phone:  [phone] (home)  Patient address:   [address] [place] [address],  Total Time spent with patient: 45 minutes  Date of Admission:  08/02/2016 Date of Discharge: 08/08/2016  Reason for Admission:  History of Present Illness: 35 year old female with long history of bipolar disorder who presented "y brother was uncomfortable with the way I decided to get some sleep" . Patient reports being under a lot of strss recently and took took more than the scheduled amount of xanax, trazodone   HPI: Below information from behavioral health assessment has been reviewed by me and I agreed with the findings.  Nicole Rangel is a 35 y.o. single female who [facility] by her brother.  Pt lives at home with her daughter. Pt reports feeling suicidal and researched different "methods" to kill herself tonight. Pt states that google was not helpful in her attempts. Pt could not identify any specific triggers that caused overdose tonight.. Pt was unable to contract for safety or confirm that no weapons were in the home.  Pt states she had 2 suicide attempts in the past of trying to cut her wrist. Pt reports she scratches her legs with her fingernails constantly. Pt currently denies any HI. Pt states she often will see images of her cat out of the corner of her eye but when she looks, the image will disappear.  Pt denies having any auditory hallucinations. Pt is currently not in engaged in outpatient services but was going to [facility] and seeing therapist, Leroy Libmanlisha Branch at the end of 06/2016.  Pt reports she does have a psychiatrist, Crystal that has prescribed her Zoloft, Geodon, Xanax, and Trazodone. Pt reports taking medications as prescribed. Pt denies any family history of SI/HI. and SA treatment. Pt denies substance abuse history but does drink  alcohol on occasions.   Pt reported feeling the following symptoms: tearful, isolating, lost in interest in things that were pleasurable, having mood swings and feeling irritable, and having difficulty concentrating.  Pt states her sleeping and eating habits have decreased with her getting no more than 3 hours of sleep a night.  Pt states she has lost weight within the past few months. Pt does not need any assistance with ADLs but was surprised when Pt has one family support, her brother. Pt states the rest of her family does not "fool with her."   Pt was dressed in hospital gown during assessment. Pt was sleepy and drowsy but was able to answer questions. Pt's mood and affect were depressed. Pt was attentive and cooperative with clinician. Pt stated she was not interested in being admitted into an inpatient facility and would not volunteer to sign the form.   Associated Signs/Symptoms: Depression Symptoms:  depressed mood, insomnia, suicidal thoughts with specific plan, suicidal attempt, anxiety, (Hypo) Manic Symptoms:  None Anxiety Symptoms:  Excessive Worry, Psychotic Symptoms:  None PTSD Symptoms: None   Past Psychiatric History: Patient has beeen in treatment with a behavioral health provider who has her on zoloft 200mg , geodon 40mg  daily, trazodone 100mg  at bed time, synthroid 100mcg  Principal Problem: Bipolar affective disorder, currently depressed, moderate (HCC) Discharge Diagnoses: Patient Active Problem List   Diagnosis Date Noted  . Bipolar 1 disorder, depressed, severe (HCC) [F31.4] 08/02/2016  . Bipolar affective disorder, currently depressed, moderate (HCC) [F31.32] 08/02/2016  .  Morbid (severe) obesity due to excess calories (HCC) [E66.01] 03/19/2016  . Borderline diabetes mellitus [R73.03] 03/19/2016  . Acquired hypothyroidism [E03.9] 12/28/2015  . Headache(784.0) [R51] 03/23/2014  . Cephalalgia [R51] 03/23/2014  . Adult ADHD [F90.9] 03/11/2013  . Generalized  anxiety disorder [F41.1] 03/11/2013  . Dyslipidemia [E78.5] 03/11/2013  . Bipolar disorder, unspecified [F31.9] 03/11/2013  . Urinary incontinence [R32] 03/11/2013  . Adult attention deficit disorder [F98.8] 03/11/2013  . Bipolar affective disorder (HCC) [F31.9] 03/11/2013    Past Medical History:  Past Medical History:  Diagnosis Date  . ADHD (attention deficit hyperactivity disorder)   . Allergy   . Anxiety   . Asthma   . Bipolar 1 disorder (HCC)   . Depression   . Headache   . Mood disorder (HCC)   . Panic disorder    History reviewed. No pertinent surgical history. Family History:  Family History  Problem Relation Age of Onset  . Diabetes Mother    Family Psychiatric  History: None Social History:  History  Alcohol Use  . 0.0 oz/week    Comment: occasional     History  Drug Use No    Social History   Social History  . Marital status: Single    Spouse name: N/A  . Number of children: 1  . Years of education: BS   Occupational History  . N/A    Social History Main Topics  . Smoking status: Never Smoker  . Smokeless tobacco: Never Used  . Alcohol use 0.0 oz/week     Comment: occasional  . Drug use: No  . Sexual activity: Not Asked   Other Topics Concern  . None   Social History Narrative   Patient is single with 1 child   Patient is right handed   Patient's education level is Bachelor's degree   Patient doesn't drink caffeine, only caffeine free    Hospital Course:  TIOMBE TOMEO was admitted for Bipolar affective disorder, currently depressed, moderate (HCC) and crisis management. She was treated with the following medications Depakote ER 500mg  po qhs for mood stabilization and Trazodone 100mg  po qhs for insomnia. Nicole Rangel was discharged with current medication and was instructed on how to take medications as prescribed; (details listed below under Medication List).  Medical problems were identified and treated as needed.  Home medications  were restarted as appropriate. She was taking Zoloft 100mg  po daily for depression which was increased to Zoloft 200mg  po daily for depression, which she tolerated well. She was also resumed on her home dose of levothyroxine 100mg  po daily for hypothyroidism. Hydroxyzine 25mg  po QID prn for anxiety, insomnia, and panic.   Improvement was monitored by observation and Nicole Rangel daily report of symptom reduction.  Emotional and mental status was monitored by daily self-inventory reports completed by Nicole Rangel and clinical staff.         Nicole Rangel was evaluated by the treatment team for stability and plans for continued recovery upon discharge.  Nicole Rangel motivation was an integral factor for scheduling further treatment.  Employment, transportation, bed availability, health status, family support, and any pending legal issues were also considered during her hospital stay.  She was offered further treatment options upon discharge including but not limited to Residential, Intensive Outpatient, and Outpatient treatment.  Nicole Rangel will follow up with the services as listed below under Follow Up Information.  Upon completion of this admission the Nicole Rangel was both mentally and  medically stable for discharge denying suicidal/homicidal ideation, auditory/visual/tactile hallucinations, delusional thoughts and paranoia.     Physical Findings: AIMS: Facial and Oral Movements Muscles of Facial Expression: None, normal Lips and Perioral Area: None, normal Jaw: None, normal Tongue: None, normal,Extremity Movements Upper (arms, wrists, hands, fingers): None, normal Lower (legs, knees, ankles, toes): None, normal, Trunk Movements Neck, shoulders, hips: None, normal, Overall Severity Severity of abnormal movements (highest score from questions above): None, normal Incapacitation due to abnormal movements: None, normal Patient's awareness of abnormal movements (rate only  patient's report): No Awareness, Dental Status Current problems with teeth and/or dentures?: No Does patient usually wear dentures?: No  CIWA:    COWS:     Musculoskeletal: Strength & Muscle Tone: within normal limits Gait & Station: normal Patient leans: N/A  Psychiatric Specialty Exam: See MD SRA Physical Exam  Nursing note and vitals reviewed. Constitutional: She is oriented to person, place, and time. She appears well-developed.  Eyes: Pupils are equal, round, and reactive to light.  Neck: Normal range of motion.  Musculoskeletal: Normal range of motion.  Neurological: She is alert and oriented to person, place, and time.  Skin: Skin is warm and dry.    Review of Systems  Psychiatric/Behavioral: Negative for depression, hallucinations, memory loss, substance abuse and suicidal ideas. The patient is not nervous/anxious and does not have insomnia.   All other systems reviewed and are negative.   Blood pressure 115/89, pulse 89, temperature 99.1 F (37.3 C), temperature source Oral, resp. rate 16, height 5\' 6"  (1.676 m), weight 105.7 kg (233 lb), SpO2 98 %.Body mass index is 37.61 kg/m.  Sleep:  Number of Hours: 6   Have you used any form of tobacco in the last 30 days? (Cigarettes, Smokeless Tobacco, Cigars, and/or Pipes): No  Has this patient used any form of tobacco in the last 30 days? (Cigarettes, Smokeless Tobacco, Cigars, and/or Pipes)  No  Blood Alcohol level:  Lab Results  Component Value Date   ETH <5 08/01/2016    Metabolic Disorder Labs:  Lab Results  Component Value Date   HGBA1C 6.1 (H) 08/06/2016   MPG 128 08/06/2016   MPG 111 03/11/2013   Lab Results  Component Value Date   PROLACTIN 21.2 08/06/2016   Lab Results  Component Value Date   CHOL 214 (H) 08/06/2016   TRIG 97 08/06/2016   HDL 49 08/06/2016   CHOLHDL 4.4 08/06/2016   VLDL 19 08/06/2016   LDLCALC 146 (H) 08/06/2016   LDLCALC 137 (H) 03/11/2013    See Psychiatric Specialty Exam and  Suicide Risk Assessment completed by Attending Physician prior to discharge.  Discharge destination:  Home  Is patient on multiple antipsychotic therapies at discharge:  No   Has Patient had three or more failed trials of antipsychotic monotherapy by history:  No  Recommended Plan for Multiple Antipsychotic Therapies: NA  Discharge Instructions    Discharge instructions    Complete by:  As directed    Please continue to take medications as directed. If your symptoms return, worsen, or persist please call your 911, report to local ER, or contact crisis hotline. Please do not drink alcohol or use any illegal substances while taking prescription medications.       Medication List    STOP taking these medications   phentermine 37.5 MG tablet Commonly known as:  ADIPEX-P   ziprasidone 20 MG capsule Commonly known as:  GEODON     TAKE these medications     Indication  cholecalciferol 1000 units tablet Commonly known as:  VITAMIN D Take 1 tablet (1,000 Units total) by mouth daily. Start taking on:  08/09/2016  Indication:  Vitamin D Deficiency   divalproex 500 MG 24 hr tablet Commonly known as:  DEPAKOTE ER Take 1 tablet (500 mg total) by mouth at bedtime.  Indication:  Rapidly Alternating Manic-Depressive Psychosis   ferrous sulfate 325 (65 FE) MG tablet Take by mouth.  Indication:  Anemia From Inadequate Iron in the Body   hydrOXYzine 25 MG tablet Commonly known as:  ATARAX/VISTARIL Take 1 tablet (25 mg total) by mouth every 6 (six) hours as needed for anxiety.  Indication:  Anxiety Neurosis, Sedation, Tension   ibuprofen 200 MG tablet Commonly known as:  ADVIL,MOTRIN Take 800 mg by mouth every 6 (six) hours as needed for moderate pain.  Indication:  Inflammation, Migraine Headache, Mild to Moderate Pain   levothyroxine 100 MCG tablet Commonly known as:  SYNTHROID, LEVOTHROID Take 1 tablet (100 mcg total) by mouth daily before breakfast. Start taking on:   08/09/2016 What changed:  when to take this  Indication:  Underactive Thyroid   sertraline 100 MG tablet Commonly known as:  ZOLOFT Take 2 tablets (200 mg total) by mouth daily. Start taking on:  08/09/2016  Indication:  Major Depressive Disorder   traZODone 100 MG tablet Commonly known as:  DESYREL Take 1 tablet (100 mg total) by mouth at bedtime as needed for sleep. What changed:  when to take this  reasons to take this  Indication:  Trouble Sleeping      Follow-up Information    [facility]. Go in 3 day(s).   Specialty:  Professional Counselor Why:  Please use [facility] to establish for medications management and therapy; hours are Monday - Friday from 8:30 - 11:30 AM.   Contact information: [address] [place] [address] [phone]         Rollow-up recommendations:  Activity:  Increase activity as tolerated Diet:  Regular house diet. Low calorie to help with weight loss.  Tests:  Depakote level will need to be checked this week. When you [facility] [place].   Comments:  SEE MD SRA.   Signed: Truman Hayward, FNP 08/08/2016, 9:45 AM   Patient seen, Suicide Assessment Completed.  Disposition Plan Reviewed

## 2016-08-08 NOTE — [facility] Group Notes (Signed)
[facility] Group Notes:  (Nursing/MHT/Case Management/Adjunct)  Date:  08/08/2016  Time:  0900 am  Type of Therapy:  Nurse Education  Participation Level:  Active  Participation Quality:  Appropriate and Attentive  Affect:  Appropriate  Cognitive:  Alert and Appropriate  Insight:  Appropriate  Engagement in Group:  Developing/Improving  Modes of Intervention:  Support  Summary of Progress/Problems: Patient's goal today is to change the negative to the positive and admit when she's not ok.    Cranford MonBeaudry, Detta Mellin Evans 08/08/2016, 12:44 PM

## 2016-08-08 NOTE — Progress Notes (Signed)
Slept 5.0 hours

## 2016-08-08 NOTE — Progress Notes (Signed)
  [facility] Discharge Plan :  Will you be returning to the same living situation after discharge:  Yes,  patient plans to return home At discharge, do you have transportation home?: Yes,  family will transport Do you have the ability to pay for your medications: Yes,  patient will be provided with prescriptions at discharge  Release of information consent forms completed and in the chart;  Patient's signature needed at discharge.  Patient to Follow up at: Follow-up Information    [facility]. Go in 3 day(s).   Specialty:  Professional Counselor Why:  Please use [facility] to establish for medications management and therapy; hours are Monday - Friday from 8:30 - 11:30 AM.   Contact information: [address] [place] [address] [phone]           Next level of care provider has access to [facility] Link:no  Safety Planning and Suicide Prevention discussed: Yes,  with patient  Have you used any form of tobacco in the last 30 days? (Cigarettes, Smokeless Tobacco, Cigars, and/or Pipes): No  Has patient been referred to the Quitline?: Patient refused referral  Patient has been referred for addiction treatment: Yes  Nicole Rangel Nicole Rangel 08/08/2016, 10:33 AM

## 2017-01-05 ENCOUNTER — Ambulatory Visit: Payer: Medicaid Other | Admitting: Podiatry

## 2017-10-08 DIAGNOSIS — Y939 Activity, unspecified: Secondary | ICD-10-CM | POA: Insufficient documentation

## 2017-10-08 DIAGNOSIS — Y929 Unspecified place or not applicable: Secondary | ICD-10-CM | POA: Insufficient documentation

## 2017-10-08 DIAGNOSIS — Y999 Unspecified external cause status: Secondary | ICD-10-CM | POA: Insufficient documentation

## 2017-10-08 DIAGNOSIS — X500XXA Overexertion from strenuous movement or load, initial encounter: Secondary | ICD-10-CM | POA: Insufficient documentation

## 2017-10-08 DIAGNOSIS — S93402A Sprain of unspecified ligament of left ankle, initial encounter: Secondary | ICD-10-CM | POA: Insufficient documentation

## 2017-10-08 NOTE — ED Triage Notes (Signed)
Pt was carrying something heavily and fell on November 25th, she injured her left ankle, pt didn't have an xray at that time, pt states that her ankle still hurts

## 2017-10-09 ENCOUNTER — Emergency Department (HOSPITAL_COMMUNITY)
Admission: EM | Admit: 2017-10-09 | Discharge: 2017-10-09 | Disposition: A | Discharge status: Home / Self Care | Payer: Self-pay | Attending: Emergency Medicine | Admitting: Emergency Medicine

## 2017-10-09 ENCOUNTER — Emergency Department (HOSPITAL_COMMUNITY): Payer: Self-pay

## 2017-10-09 ENCOUNTER — Encounter (HOSPITAL_COMMUNITY): Payer: Self-pay

## 2017-10-09 DIAGNOSIS — S93402A Sprain of unspecified ligament of left ankle, initial encounter: Secondary | ICD-10-CM

## 2017-10-09 NOTE — ED Provider Notes (Signed)
[facility]-EMERGENCY DEPT Provider Note   CSN: [id number] Arrival date & time: 10/08/17  2314     History   Chief Complaint Chief Complaint  Patient presents with  . Ankle Pain    HPI Nicole Rangel is a 37 y.o. female.  HPI Presents complaining of left ankle pain since thanksgiving. "rolled" her ankle at that time. Continues to cause mild to moderate pain. No other complaints. Worsened by movement and palpation    Past Medical History:  Diagnosis Date  . ADHD (attention deficit hyperactivity disorder)   . Allergy   . Anxiety   . Asthma   . Bipolar 1 disorder (HCC)   . Depression   . Headache   . Mood disorder (HCC)   . Panic disorder     Patient Active Problem List   Diagnosis Date Noted  . Bipolar 1 disorder, depressed, severe (HCC) 08/02/2016  . Bipolar affective disorder, currently depressed, moderate (HCC) 08/02/2016  . Morbid (severe) obesity due to excess calories (HCC) 03/19/2016  . Borderline diabetes mellitus 03/19/2016  . Acquired hypothyroidism 12/28/2015  . Headache(784.0) 03/23/2014  . Cephalalgia 03/23/2014  . Adult ADHD 03/11/2013  . Generalized anxiety disorder 03/11/2013  . Dyslipidemia 03/11/2013  . Bipolar disorder, unspecified (HCC) 03/11/2013  . Urinary incontinence 03/11/2013  . Adult attention deficit disorder 03/11/2013  . Bipolar affective disorder (HCC) 03/11/2013    History reviewed. No pertinent surgical history.  OB History    No data available       Home Medications    Prior to Admission medications   Medication Sig Start Date End Date Taking? Authorizing Provider  cholecalciferol (VITAMIN D) 1000 units tablet Take 1 tablet (1,000 Units total) by mouth daily. 08/09/16   Truman HaywardStarkes, Takia S, FNP  divalproex (DEPAKOTE ER) 500 MG 24 hr tablet Take 1 tablet (500 mg total) by mouth at bedtime. 08/08/16   Truman HaywardStarkes, Takia S, FNP  ferrous sulfate 325 (65 FE) MG tablet Take by mouth.    [provider]  hydrOXYzine (ATARAX/VISTARIL) 25 MG tablet Take 1 tablet (25 mg total) by mouth every 6 (six) hours as needed for anxiety. 08/08/16   Truman HaywardStarkes, Takia S, FNP  ibuprofen (ADVIL,MOTRIN) 200 MG tablet Take 800 mg by mouth every 6 (six) hours as needed for moderate pain.    [provider]  levothyroxine (SYNTHROID, LEVOTHROID) 100 MCG tablet Take 1 tablet (100 mcg total) by mouth daily before breakfast. 08/09/16   Starkes, Juel Burrowakia S, FNP  sertraline (ZOLOFT) 100 MG tablet Take 2 tablets (200 mg total) by mouth daily. 08/09/16   Truman HaywardStarkes, Takia S, FNP  traZODone (DESYREL) 100 MG tablet Take 1 tablet (100 mg total) by mouth at bedtime as needed for sleep. 08/08/16   Truman HaywardStarkes, Takia S, FNP    Family History Family History  Problem Relation Age of Onset  . Diabetes Mother     Social History Social History   Tobacco Use  . Smoking status: Never Smoker  . Smokeless tobacco: Never Used  Substance Use Topics  . Alcohol use: Yes    Alcohol/week: 0.0 oz    Comment: occasional  . Drug use: No     Allergies   Lidocaine   Review of Systems Review of Systems  All other systems reviewed and are negative.    Physical Exam Updated Vital Signs BP (!) 146/105 (BP Location: Right Arm)   Pulse 98   Temp 98 F (36.7 C) (Oral)   Resp 18  LMP 10/02/2017   SpO2 100%   Physical Exam  Constitutional: She is oriented to person, place, and time. She appears well-developed and well-nourished.  HENT:  Head: Normocephalic.  Eyes: EOM are normal.  Neck: Normal range of motion.  Pulmonary/Chest: Effort normal.  Abdominal: She exhibits no distension.  Musculoskeletal: Normal range of motion.  Left lateral malleolus tenderness of the left. Normal pulses left foot.   Neurological: She is alert and oriented to person, place, and time.  Psychiatric: She has a normal mood and affect.  Nursing note and vitals reviewed.    ED Treatments / Results  Labs (all labs ordered are listed, but  only abnormal results are displayed) Labs Reviewed - No data to display  EKG  EKG Interpretation None       Radiology Dg Ankle Complete Left  Result Date: 10/09/2017 CLINICAL DATA:  37 y/o F; twisting injury of the ankle with posterior pain. EXAM: LEFT ANKLE COMPLETE - 3+ VIEW COMPARISON:  None. FINDINGS: There is no evidence of fracture, dislocation, or joint effusion. There is no evidence of arthropathy or other focal bone abnormality. Soft tissues are unremarkable. IMPRESSION: Negative. Electronically Signed   By: Mitzi Hansen M.D.   On: 10/09/2017 00:24    Procedures Procedures (including critical care time)  Medications Ordered in ED Medications - No data to display   Initial Impression / Assessment and Plan / ED Course  I have reviewed the triage vital signs and the nursing notes.  Pertinent labs & imaging results that were available during my care of the patient were reviewed by me and considered in my medical decision making (see chart for details).       Final Clinical Impressions(s) / ED Diagnoses   Final diagnoses:  Sprain of left ankle, unspecified ligament, initial encounter    ED Discharge Orders    None       Azalia Bilis, MD 10/09/17 [phone]

## 2019-09-08 ENCOUNTER — Encounter: Payer: Self-pay | Admitting: Emergency Medicine

## 2019-09-08 ENCOUNTER — Other Ambulatory Visit: Payer: Self-pay

## 2019-09-08 ENCOUNTER — Emergency Department: Payer: BLUE CROSS/BLUE SHIELD

## 2019-09-08 ENCOUNTER — Emergency Department
Admission: EM | Admit: 2019-09-08 | Discharge: 2019-09-08 | Disposition: A | Discharge status: Home / Self Care | Payer: BLUE CROSS/BLUE SHIELD | Attending: Emergency Medicine | Admitting: Emergency Medicine

## 2019-09-08 DIAGNOSIS — Y9301 Activity, walking, marching and hiking: Secondary | ICD-10-CM | POA: Diagnosis not present

## 2019-09-08 DIAGNOSIS — W109XXA Fall (on) (from) unspecified stairs and steps, initial encounter: Secondary | ICD-10-CM | POA: Insufficient documentation

## 2019-09-08 DIAGNOSIS — Y999 Unspecified external cause status: Secondary | ICD-10-CM | POA: Insufficient documentation

## 2019-09-08 DIAGNOSIS — Y929 Unspecified place or not applicable: Secondary | ICD-10-CM | POA: Diagnosis not present

## 2019-09-08 DIAGNOSIS — M25572 Pain in left ankle and joints of left foot: Secondary | ICD-10-CM | POA: Diagnosis not present

## 2019-09-08 DIAGNOSIS — W19XXXA Unspecified fall, initial encounter: Secondary | ICD-10-CM

## 2019-09-08 DIAGNOSIS — E039 Hypothyroidism, unspecified: Secondary | ICD-10-CM | POA: Diagnosis not present

## 2019-09-08 DIAGNOSIS — J45909 Unspecified asthma, uncomplicated: Secondary | ICD-10-CM | POA: Diagnosis not present

## 2019-09-08 DIAGNOSIS — Z79899 Other long term (current) drug therapy: Secondary | ICD-10-CM | POA: Insufficient documentation

## 2019-09-08 DIAGNOSIS — S82891A Other fracture of right lower leg, initial encounter for closed fracture: Secondary | ICD-10-CM | POA: Diagnosis not present

## 2019-09-08 DIAGNOSIS — S99911A Unspecified injury of right ankle, initial encounter: Secondary | ICD-10-CM | POA: Diagnosis present

## 2019-09-08 MED ORDER — OXYCODONE-ACETAMINOPHEN 5-325 MG PO TABS
2.0000 | ORAL_TABLET | Freq: Once | ORAL | Status: AC
  Administered 2019-09-08: 2 via ORAL
  Filled 2019-09-08: qty 2

## 2019-09-08 MED ORDER — OXYCODONE-ACETAMINOPHEN 7.5-325 MG PO TABS: 1.0000 | ORAL_TABLET | Freq: Four times a day (QID) | ORAL | 0 refills | Status: AC | PRN

## 2019-09-08 NOTE — ED Triage Notes (Signed)
Presents via EMS  S/p fall  Twisted right ankle

## 2019-09-08 NOTE — ED Provider Notes (Signed)
[facility] Emergency Department Provider Note  ____________________________________________   First MD Initiated Contact with Patient 09/08/19 1137     (approximate)  I have reviewed the triage vital signs and the nursing notes.   HISTORY  Chief Complaint Ankle Pain and Fall   HPI Nicole Rangel is a 38 y.o. female presents to the ED via EMS after a fall this morning.  Patient states that she missed the last step and twisted her right ankle.  She denies any head injury or loss of consciousness during this event.  She also complains of some mild pain in her left ankle but the right is greater.  She denies any previous fractures to her right ankle.  She rates her pain as a 6 out of 10.      Past Medical History:  Diagnosis Date  . ADHD (attention deficit hyperactivity disorder)   . Allergy   . Anxiety   . Asthma   . Bipolar 1 disorder (HCC)   . Depression   . Headache   . Mood disorder (HCC)   . Panic disorder     Patient Active Problem List   Diagnosis Date Noted  . Bipolar 1 disorder, depressed, severe (HCC) 08/02/2016  . Bipolar affective disorder, currently depressed, moderate (HCC) 08/02/2016  . Morbid (severe) obesity due to excess calories (HCC) 03/19/2016  . Borderline diabetes mellitus 03/19/2016  . Acquired hypothyroidism 12/28/2015  . Headache(784.0) 03/23/2014  . Cephalalgia 03/23/2014  . Adult ADHD 03/11/2013  . Generalized anxiety disorder 03/11/2013  . Dyslipidemia 03/11/2013  . Bipolar disorder, unspecified (HCC) 03/11/2013  . Urinary incontinence 03/11/2013  . Adult attention deficit disorder 03/11/2013  . Bipolar affective disorder (HCC) 03/11/2013    History reviewed. No pertinent surgical history.  Prior to Admission medications   Medication Sig Start Date End Date Taking? Authorizing Provider  cholecalciferol (VITAMIN D) 1000 units tablet Take 1 tablet (1,000 Units total) by mouth daily. 08/09/16   Starkes-Perry,  Juel Burrow, FNP  divalproex (DEPAKOTE ER) 500 MG 24 hr tablet Take 1 tablet (500 mg total) by mouth at bedtime. 08/08/16   Starkes-Perry, Juel Burrow, FNP  ferrous sulfate 325 (65 FE) MG tablet Take by mouth.    [provider]  levothyroxine (SYNTHROID, LEVOTHROID) 100 MCG tablet Take 1 tablet (100 mcg total) by mouth daily before breakfast. 08/09/16   Starkes-Perry, Juel Burrow, FNP  oxyCODONE-acetaminophen (PERCOCET) 7.5-325 MG tablet Take 1 tablet by mouth every 6 (six) hours as needed for severe pain. 09/08/19 09/07/20  Tommi Rumps, PA-C  sertraline (ZOLOFT) 100 MG tablet Take 2 tablets (200 mg total) by mouth daily. 08/09/16   Starkes-Perry, Juel Burrow, FNP  traZODone (DESYREL) 100 MG tablet Take 1 tablet (100 mg total) by mouth at bedtime as needed for sleep. 08/08/16   Maryagnes Amos, FNP    Allergies Lidocaine  Family History  Problem Relation Age of Onset  . Diabetes Mother     Social History Social History   Tobacco Use  . Smoking status: Never Smoker  . Smokeless tobacco: Never Used  Substance Use Topics  . Alcohol use: Yes    Alcohol/week: 0.0 standard drinks    Comment: occasional  . Drug use: No    Review of Systems Constitutional: No fever/chills Eyes: No visual changes. ENT: No trauma. Cardiovascular: Denies chest pain. Respiratory: Denies shortness of breath. Gastrointestinal: No abdominal pain.  No nausea, no vomiting. Genitourinary: Negative for dysuria. Musculoskeletal: Positive right and left ankle pain  with the right being more painful. Skin: Negative for rash. Neurological: Negative for headaches, focal weakness or numbness. ____________________________________________   PHYSICAL EXAM:  VITAL SIGNS: ED Triage Vitals  Enc Vitals Group     BP 09/08/19 1125 130/89     Pulse Rate 09/08/19 1125 85     Resp 09/08/19 1125 18     Temp 09/08/19 1125 98.1 F (36.7 C)     Temp Source 09/08/19 1125 Oral     SpO2 09/08/19 1125 98 %     Weight  09/08/19 1127 230 lb (104.3 kg)     Height 09/08/19 1127 5\' 7"  (1.702 m)     Head Circumference --      Peak Flow --      Pain Score 09/08/19 1127 6     Pain Loc --      Pain Edu? --      Excl. in [place]? --     Constitutional: Alert and oriented. Well appearing and in no acute distress. Eyes: Conjunctivae are normal.  Head: Atraumatic. Nose: No trauma. Neck: No stridor.  No cervical tenderness to palpation posteriorly. Cardiovascular: Normal rate, regular rhythm. Grossly normal heart sounds.  Good peripheral circulation. Respiratory: Normal respiratory effort.  No retractions. Lungs CTAB. Gastrointestinal: Soft and nontender. No distention.  Musculoskeletal: Examination of the right ankle there is no gross deformity however there is marked tenderness on palpation bilateral malleolus with soft tissue edema.  No discoloration.  There is a very superficial abrasion to the anterior portion of her ankle without active bleeding.  Range of motion is restricted secondary to pain.  Motors sensory function intact.  Capillary refills less than 3 seconds.  Strong pulses present.  On examination of the left ankle there is no gross deformity there is some mild tenderness on palpation and patient is able to move with less discomfort in comparison to the right.  Skin is intact.  Motor sensory function intact.  Pulses present.  Nontender bilateral knees and no appreciated pain is noted with compression of the hips. Neurologic:  Normal speech and language. No gross focal neurologic deficits are appreciated.  Skin:  Skin is warm, dry. Psychiatric: Mood and affect are normal. Speech and behavior are normal.  ____________________________________________   LABS (all labs ordered are listed, but only abnormal results are displayed)  Labs Reviewed - No data to display  RADIOLOGY   Official radiology report(s): Dg Ankle Complete Left  Result Date: 09/08/2019 CLINICAL DATA:  Left ankle pain after fall. EXAM:  LEFT ANKLE COMPLETE - 3+ VIEW COMPARISON:  October 09, 2017. FINDINGS: There is no evidence of fracture, dislocation, or joint effusion. There is no evidence of arthropathy or other focal bone abnormality. Soft tissues are unremarkable. IMPRESSION: Negative. Electronically Signed   By: Marijo Conception M.D.   On: 09/08/2019 14:04   Dg Ankle Complete Right  Result Date: 09/08/2019 CLINICAL DATA:  Pain after fall. EXAM: RIGHT ANKLE - COMPLETE 3+ VIEW COMPARISON:  No recent available. FINDINGS: Comminuted fracture noted of the medial malleolus. Displaced fracture fragments are present. Displaced comminuted distal right fibular shaft fracture. IMPRESSION: 1.  Displaced comminuted fracture of the medial malleolus. 2.  Displaced comminuted distal right fibular shaft fracture. Electronically Signed   By: Marcello Moores  Register   On: 09/08/2019 12:07    ____________________________________________   PROCEDURES  Procedure(s) performed (including Critical Care):  Procedures OCL posterior and stirrup splint was applied to the right ankle.  Crutches were given to the patient.  ____________________________________________   INITIAL IMPRESSION / ASSESSMENT AND PLAN / ED COURSE  As part of my medical decision making, I reviewed the following data within the electronic MEDICAL RECORD NUMBER Notes from prior ED visits and [place] Controlled Substance Database  38 year old female presents to the ED after a fall going down some steps in which she thinks she missed the last step.  This was not a syncopal episode.  Patient complains of both ankles hurting but right greater than left.  She was brought to the ED via EMS.  Physical exam is suspicious for fracture of the right ankle.  Left ankle with minimal suspicion.  Right ankle x-ray is positive for a distal fibular shaft fracture and a medial malleolus fracture with ankle mortise intact.  Left ankle is negative for fracture.  After speaking with Dr. Deeann SaintHoward Miller who is the  orthopedist on-call patient was placed in a posterior and stirrup ankle splint and given crutches.  She is to call the office so that he can evaluate her in 2 days.  She until then she is to ice and elevate her ankle and a prescription for Percocet was sent to her pharmacy.  ____________________________________________   FINAL CLINICAL IMPRESSION(S) / ED DIAGNOSES  Final diagnoses:  Closed fracture of right ankle, initial encounter  Fall, initial encounter     ED Discharge Orders         Ordered    oxyCODONE-acetaminophen (PERCOCET) 7.5-325 MG tablet  Every 6 hours PRN     09/08/19 1414           Note:  This document was prepared using Dragon voice recognition software and may include unintentional dictation errors.    Tommi RumpsSummers, Eleazar Kimmey L, PA-C 09/08/19 1428    Concha SeFunke, Mary E, MD 09/12/19 [phone]

## 2019-09-08 NOTE — Discharge Instructions (Addendum)
Call Dr. Ammie Ferrier office to let them know that Dr. Sabra Heck wants to see you in the office in 2 days.  Ice and elevate to reduce swelling.  Use your crutches for walking for added support and protection.  Do not apply weight to your right ankle.  The pain medication was sent to your pharmacy.  This is every 6 hours as needed for pain.  Be aware that this medication could cause drowsiness and increase your risk for falling.  Wear the splint until seen by the orthopedist.  You also will be sore in multiple areas due to your fall.  This is normal for the next 3 to 4 days.

## 2020-01-03 IMAGING — DX DG ANKLE COMPLETE 3+V*L*
3 series · 3 of 3 positions shown · non-contrast
Comparison: October 09, 2017.

CLINICAL DATA: Left ankle pain after fall.

EXAM:
LEFT ANKLE COMPLETE - 3+ VIEW

[ankle ap]
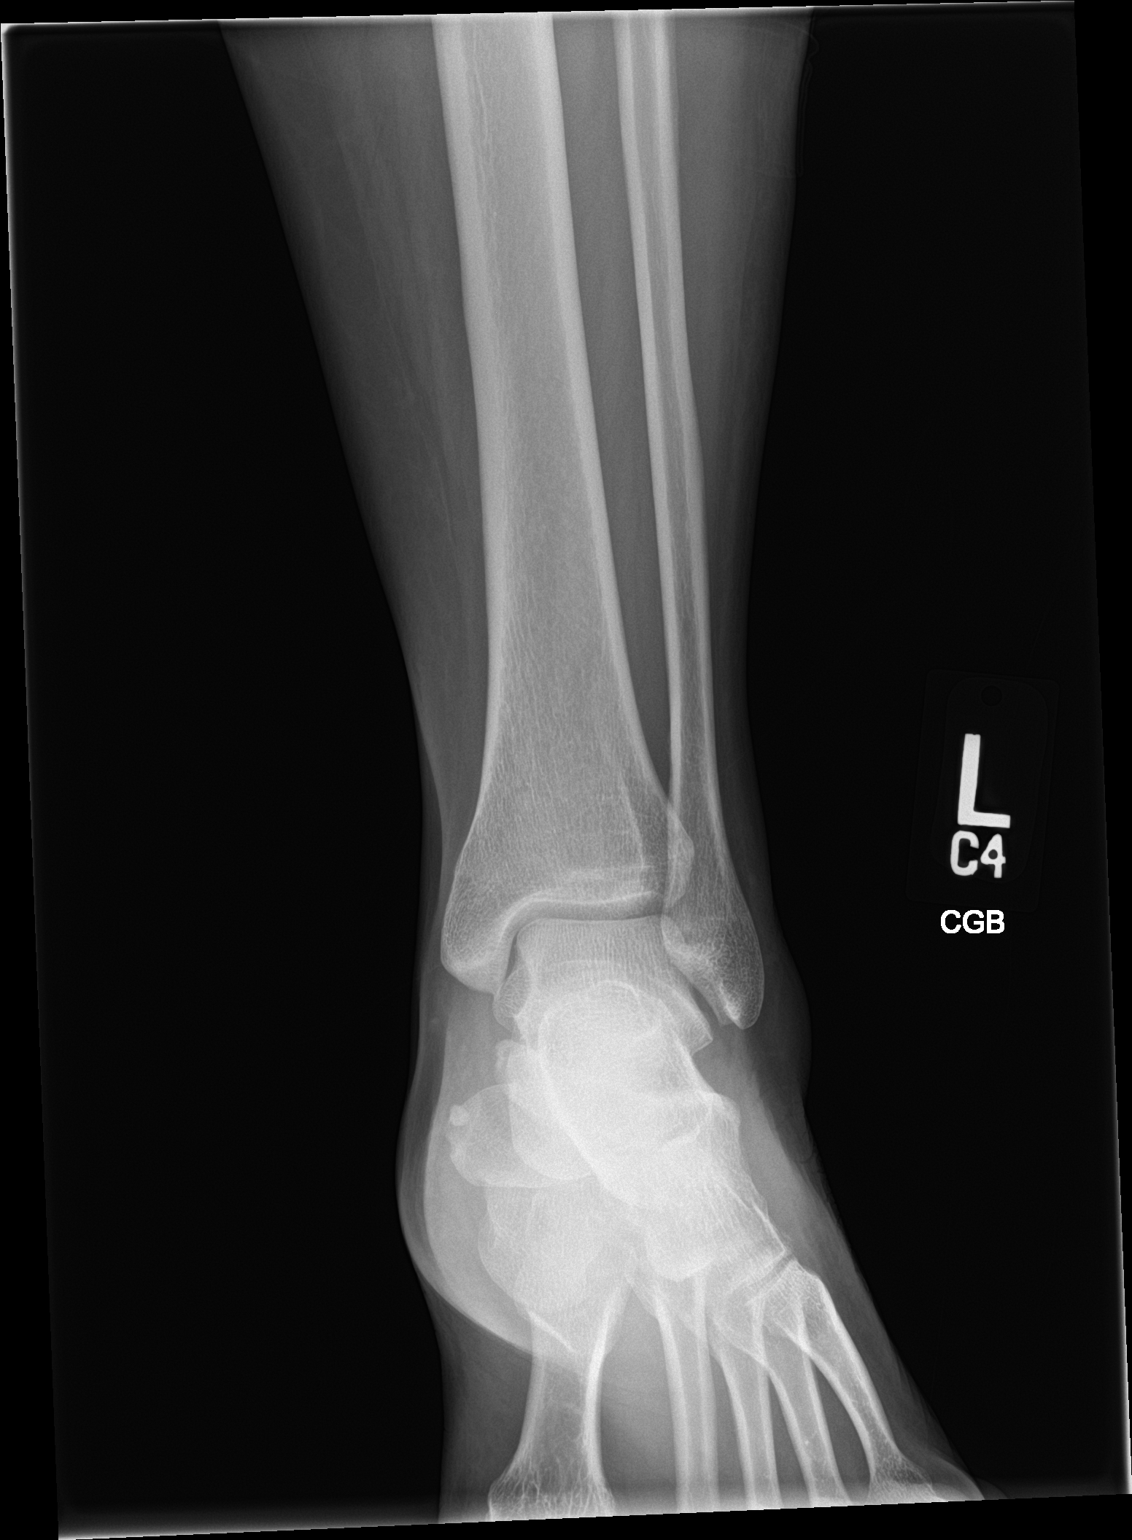

[ankle obl]
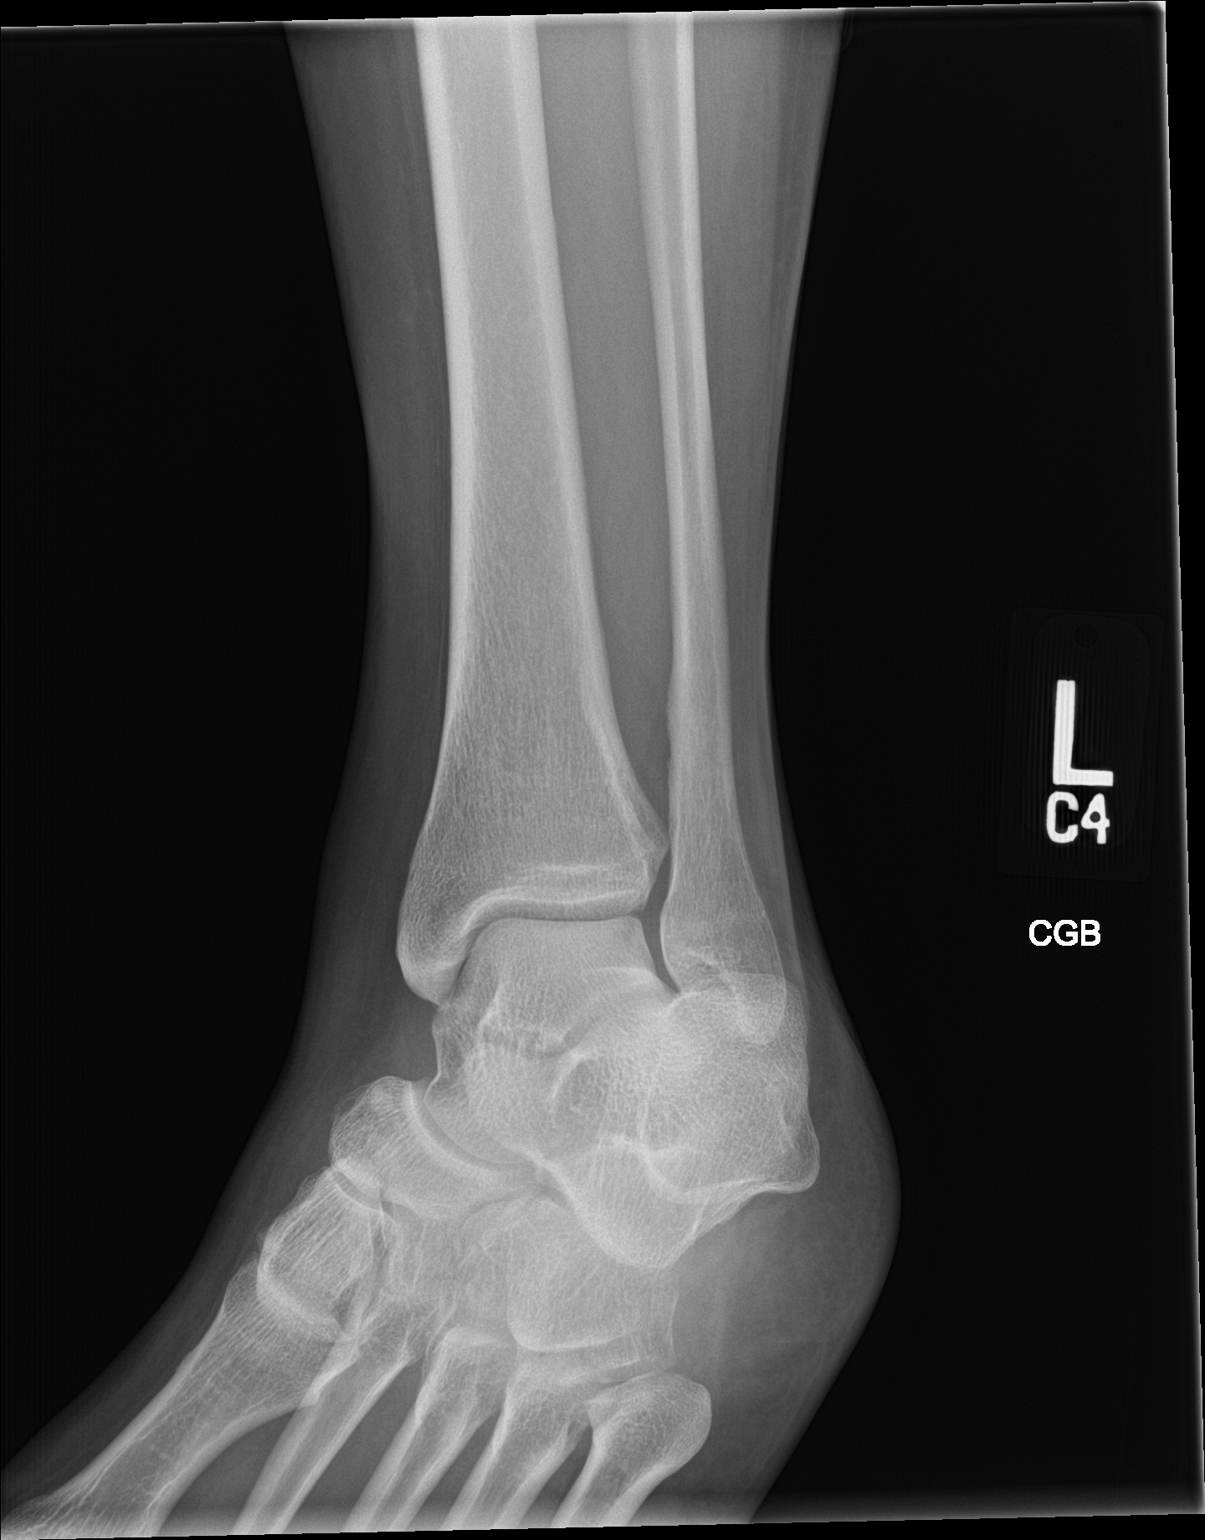

[ankle lat]
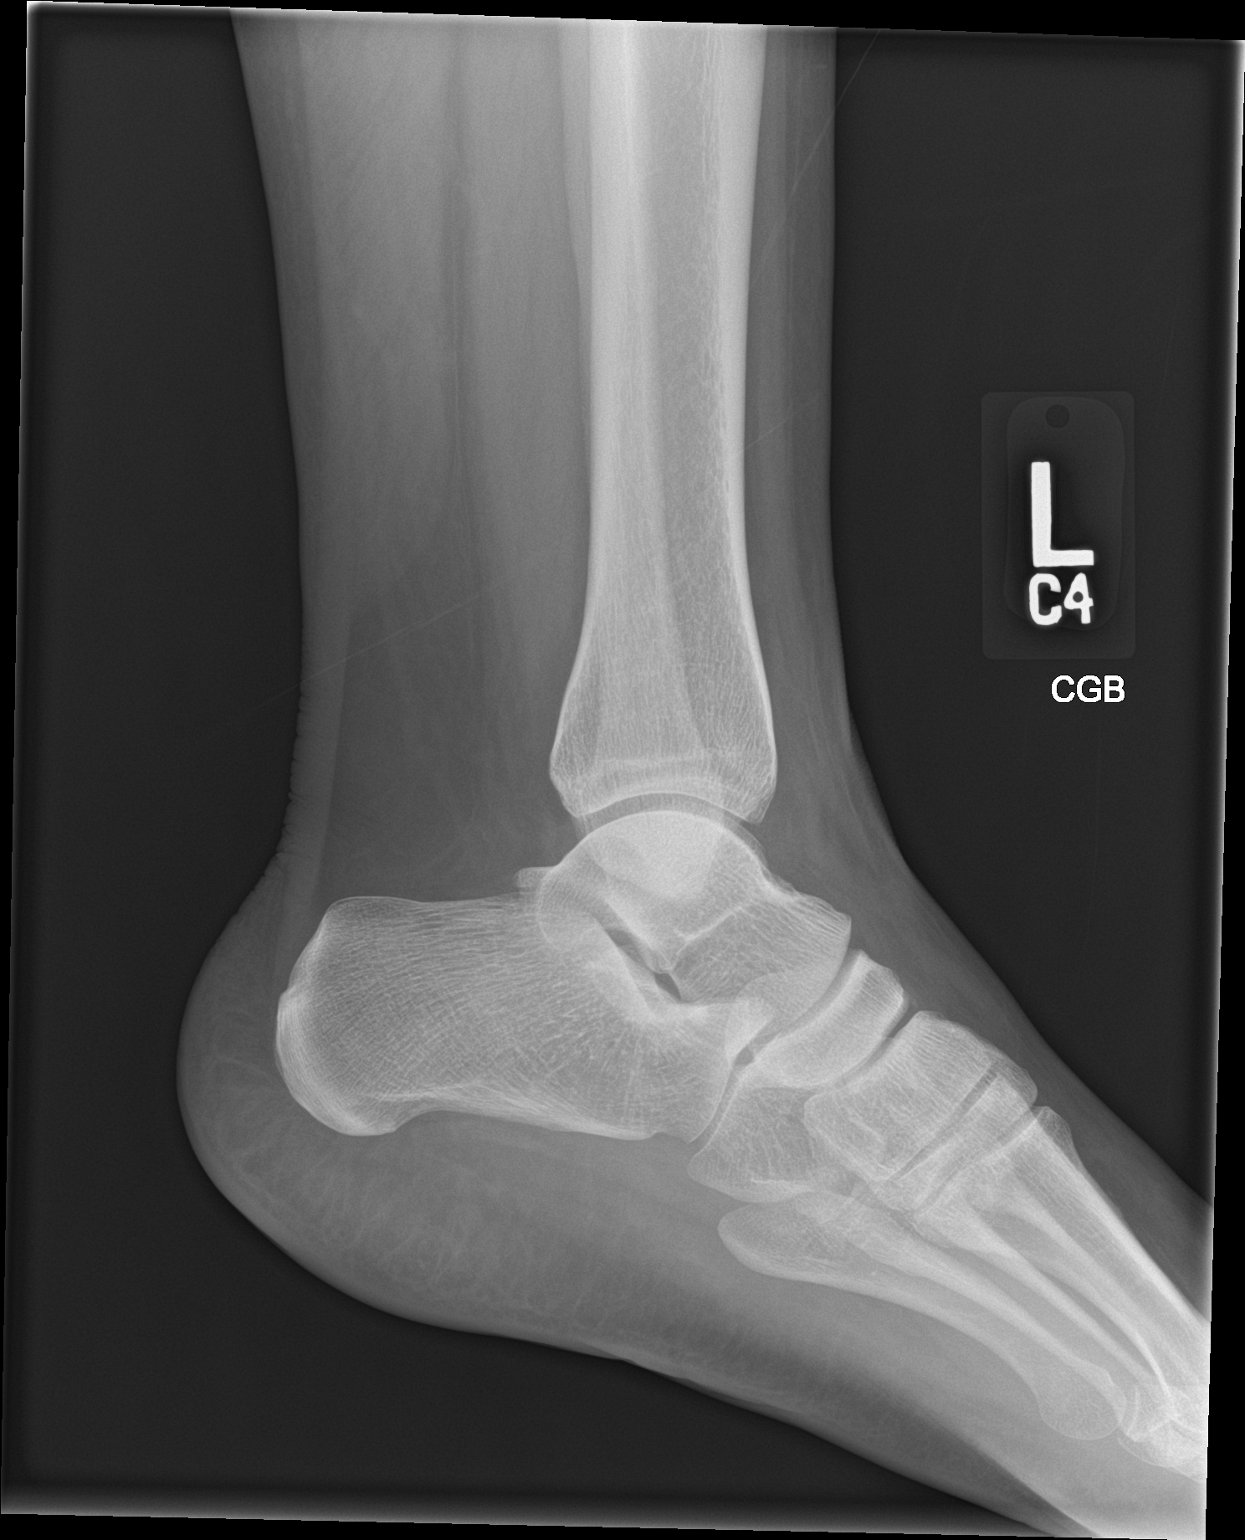

[3 of 3 positions shown; findings below may reference images not displayed]

FINDINGS: There is no evidence of fracture, dislocation, or joint effusion.
There is no evidence of arthropathy or other focal bone abnormality.
Soft tissues are unremarkable.
IMPRESSION: Negative.

## 2020-01-06 ENCOUNTER — Telehealth (HOSPITAL_COMMUNITY): Payer: Self-pay | Admitting: Professional

## 2020-01-14 ENCOUNTER — Other Ambulatory Visit (HOSPITAL_COMMUNITY): Payer: BLUE CROSS/BLUE SHIELD | Attending: Psychiatry | Admitting: Licensed Clinical Social Worker

## 2020-01-14 ENCOUNTER — Other Ambulatory Visit: Payer: Self-pay

## 2020-01-14 DIAGNOSIS — F314 Bipolar disorder, current episode depressed, severe, without psychotic features: Secondary | ICD-10-CM | POA: Diagnosis not present

## 2020-02-24 NOTE — Psych (Signed)
Virtual Visit via Video Note  I connected with Nicole Rangel on 01/14/20 at 11:00 AM EDT by a video enabled telemedicine application and verified that I am speaking with the correct person using two identifiers.   I discussed the limitations of evaluation and management by telemedicine and the availability of in person appointments. The patient expressed understanding and agreed to proceed.  Follow Up Instructions:    I discussed the assessment and treatment plan with the patient. The patient was provided an opportunity to ask questions and all were answered. The patient agreed with the plan and demonstrated an understanding of the instructions.   The patient was advised to call back or seek an in-person evaluation if the symptoms worsen or if the condition fails to improve as anticipated.  I provided 60 minutes of non-face-to-face time during this encounter.   Nicole Rangel, [facility]     Comprehensive Clinical Assessment (CCA) Note  01/14/2020 Nicole Rangel [id number]  Visit Diagnosis:      ICD-10-CM   1. Bipolar 1 disorder, depressed, severe (HCC)  F31.4       CCA Part One  Part One has been completed on paper by the patient.  (See scanned document in Chart Review)  CCA Part Two A  Intake/Chief Complaint:  CCA Intake With Chief Complaint CCA Part Two Date: 01/14/20 CCA Part Two Time: 1100 Chief Complaint/Presenting Problem: Pt reports increased depression and struggles in every day life. Pt referred by cln Nicole Rangel Patients Currently Reported Symptoms/Problems: Voices in head; increased depression; decreased ADLs; increased anxiety; decreased appetite; work problems, change in sexual interest, panic attacks Collateral Involvement: notes Individual's Strengths: understands treatment options; regular providers Individual's Preferences: to feel better Individual's Abilities: can participate in treatment Type of Services Patient Feels Are Needed: individual Initial  Clinical Notes/Concerns: pt declines PHP in order to f/u with ECT services  Mental Health Symptoms Depression:  Depression: Change in energy/activity, Difficulty Concentrating, Fatigue, Hopelessness, Worthlessness, Tearfulness, Sleep (too much or little), Irritability, Increase/decrease in appetite  Mania:     Anxiety:      Psychosis:  Psychosis: Hallucinations  Trauma:     Obsessions:     Compulsions:     Inattention:     Hyperactivity/Impulsivity:     Oppositional/Defiant Behaviors:     Borderline Personality:  Emotional Irregularity: Mood lability, Unstable self-image, Potentially harmful impulsivity  Other Mood/Personality Symptoms:      Mental Status Exam Appearance and self-care  Stature:  Stature: Average  Weight:  Weight: Overweight  Clothing:  Clothing: Casual  Grooming:  Grooming: Normal  Cosmetic use:  Cosmetic Use: None  Posture/gait:  Posture/Gait: Normal  Motor activity:  Motor Activity: Not Remarkable  Sensorium  Attention:  Attention: Distractible  Concentration:  Concentration: Focuses on irrelevancies, Anxiety interferes  Orientation:     Recall/memory:  Recall/Memory: Normal  Affect and Mood  Affect:  Affect: Depressed  Mood:  Mood: Depressed  Relating  Eye contact:  Eye Contact: Fleeting  Facial expression:  Facial Expression: Depressed  Attitude toward examiner:  Attitude Toward Examiner: Cooperative  Thought and Language  Speech flow: Speech Flow: Normal  Thought content:  Thought Content: Appropriate to mood and circumstances  Preoccupation:  Preoccupations: Ruminations  Hallucinations:  Hallucinations: Auditory  Organization:     Company secretary of Knowledge:  Fund of Knowledge: Average  Intelligence:  Intelligence: Average  Abstraction:  Abstraction: Normal  Judgement:  Judgement: Poor  Reality Testing:  Reality Testing: Adequate  Insight:  Insight: Poor  Decision Making:  Decision Making: Vacilates  Social Functioning  Social  Maturity:  Social Maturity: Isolates  Social Judgement:  Social Judgement: Normal  Stress  Stressors:  Stressors: Family conflict, Grief/losses, Illness, Money  Coping Ability:  Coping Ability: Deficient supports  Skill Deficits:     Supports:      Family and Psychosocial History: Family history Are you sexually active?: No What is your sexual orientation?: heterosexual Has your sexual activity been affected by drugs, alcohol, medication, or emotional stress?: unknown Does patient have children?: Yes How many children?: 1 How is patient's relationship with their children?: 21yo daughter  Childhood History:  Childhood History By whom was/is the patient raised?: Mother Description of patient's relationship with caregiver when they were a child: felt mother was neglectful, unsupportive; no contact w father, he "doesnt count" How were you disciplined when you got in trouble as a child/adolescent?: "she just wasnt there for me" Does patient have siblings?: Yes Number of Siblings: 3 Description of patient's current relationship with siblings: 2 deceased; 1 younger brother- ok relationship Did patient suffer any verbal/emotional/physical/sexual abuse as a child?: No Did patient suffer from severe childhood neglect?: No Has patient ever been sexually abused/assaulted/raped as an adolescent or adult?: No Was the patient ever a victim of a crime or a disaster?: No Witnessed domestic violence?: No Has patient been effected by domestic violence as an adult?: No  CCA Part Two B  Employment/Work Situation: Employment / Work Copywriter, advertising Employment situation: Product manager job has been impacted by current illness: No What is the longest time patient has a held a job?: one year 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 Did You Receive Any Psychiatric Treatment/Services While in the [employer]?: No Are There Guns or Other Weapons in Your Home?: No  Education:     Religion: Religion/Spirituality Are You A Religious Person?: Yes("spiritual")  Leisure/Recreation: Leisure / Recreation Leisure and Hobbies: reading, uses Management consultant, loves to go to [employer]  Exercise/Diet: Exercise/Diet Do You Exercise?: No Have You Gained or Lost A Significant Amount of Weight in the Past Six Months?: No Do You Follow a Special Diet?: No Do You Have Any Trouble Sleeping?: Yes Explanation of Sleeping Difficulties: struggles to get restful sleep  CCA Part Two C  Alcohol/Drug Use: Alcohol / Drug Use Pain Medications: See MAR Prescriptions: See MAR  Over the Counter: See MAR History of alcohol / drug use?: No history of alcohol / drug abuse Longest period of sobriety (when/how long): na                      CCA Part Three  ASAM's:  Six Dimensions of Multidimensional Assessment  Dimension 1:  Acute Intoxication and/or Withdrawal Potential:     Dimension 2:  Biomedical Conditions and Complications:     Dimension 3:  Emotional, Behavioral, or Cognitive Conditions and Complications:     Dimension 4:  Readiness to Change:     Dimension 5:  Relapse, Continued use, or Continued Problem Potential:     Dimension 6:  Recovery/Living Environment:      Substance use Disorder (SUD)    Social Function:  Social Functioning Social Maturity: Isolates Social Judgement: Normal  Stress:  Stress Stressors: Family conflict, Grief/losses, Illness, Money Coping Ability: Deficient supports Patient Takes Medications The Way The Doctor Instructed?: Yes Priority Risk: Moderate Risk  Risk Assessment- Self-Harm Potential: Risk Assessment For Self-Harm Potential Thoughts of Self-Harm: No current thoughts Additional Information for  Self-Harm Potential: Previous Attempts Additional Comments for Self-Harm Potential: Pt reports a couple of attempts"but I don't know if I was trying to kill myself or go to sleep for a while."  Risk Assessment -Dangerous to Others  Potential: Risk Assessment For Dangerous to Others Potential Method: No Plan  DSM5 Diagnoses: Patient Active Problem List   Diagnosis Date Noted  . Bipolar 1 disorder, depressed, severe (HCC) 08/02/2016  . Bipolar affective disorder, currently depressed, moderate (HCC) 08/02/2016  . Morbid (severe) obesity due to excess calories (HCC) 03/19/2016  . Borderline diabetes mellitus 03/19/2016  . Acquired hypothyroidism 12/28/2015  . Headache(784.0) 03/23/2014  . Cephalalgia 03/23/2014  . Adult ADHD 03/11/2013  . Generalized anxiety disorder 03/11/2013  . Dyslipidemia 03/11/2013  . Bipolar disorder, unspecified (HCC) 03/11/2013  . Urinary incontinence 03/11/2013  . Adult attention deficit disorder 03/11/2013  . Bipolar affective disorder (HCC) 03/11/2013    Patient Centered Plan: Patient is on the following Treatment Plan(s):  Depression  Recommendations for Services/Supports/Treatments: Recommendations for Services/Supports/Treatments Recommendations For Services/Supports/Treatments: Partial Hospitalization(Pt declines PHP at this time. Pt is scheduled to f/u with ECT at [facility] on Friday)  Treatment Plan Summary:  Pt declines PHP at this time. Pt is scheduled to f/u with ECT in 2 days  Referrals to Alternative Service(s): Referred to Alternative Service(s):   Place:   Date:   Time:    Referred to Alternative Service(s):   Place:   Date:   Time:    Referred to Alternative Service(s):   Place:   Date:   Time:    Referred to Alternative Service(s):   Place:   Date:   Time:     Nicole Rangel

## 2020-03-17 ENCOUNTER — Telehealth (HOSPITAL_COMMUNITY): Payer: Self-pay | Admitting: Psychiatry

## 2020-03-17 NOTE — Telephone Encounter (Signed)
D:  Pt called twice and left vm's inquiring about where to drop off the paperwork that was given to her.  According to pt, she has completed ECT and is ready to start group.  A:  Looked at [facility] from April 2021.  Informed PHP staff that pt would be dropping off paperwork tomorrow and is wanting a start date.

## 2020-03-23 ENCOUNTER — Other Ambulatory Visit: Payer: Self-pay

## 2020-03-23 ENCOUNTER — Other Ambulatory Visit (HOSPITAL_COMMUNITY): Payer: BLUE CROSS/BLUE SHIELD | Attending: Psychiatry | Admitting: Professional

## 2020-03-23 DIAGNOSIS — F314 Bipolar disorder, current episode depressed, severe, without psychotic features: Secondary | ICD-10-CM

## 2020-03-29 ENCOUNTER — Other Ambulatory Visit (HOSPITAL_COMMUNITY): Payer: BLUE CROSS/BLUE SHIELD

## 2020-03-29 ENCOUNTER — Other Ambulatory Visit: Payer: Self-pay

## 2020-03-30 ENCOUNTER — Other Ambulatory Visit (HOSPITAL_COMMUNITY): Payer: BLUE CROSS/BLUE SHIELD

## 2020-03-30 ENCOUNTER — Other Ambulatory Visit: Payer: Self-pay

## 2020-03-31 ENCOUNTER — Other Ambulatory Visit (HOSPITAL_COMMUNITY): Payer: BLUE CROSS/BLUE SHIELD | Admitting: Family

## 2020-03-31 ENCOUNTER — Other Ambulatory Visit: Payer: Self-pay

## 2020-03-31 ENCOUNTER — Other Ambulatory Visit (HOSPITAL_COMMUNITY): Payer: BLUE CROSS/BLUE SHIELD

## 2020-04-01 ENCOUNTER — Other Ambulatory Visit (HOSPITAL_COMMUNITY): Payer: BLUE CROSS/BLUE SHIELD

## 2020-04-01 ENCOUNTER — Other Ambulatory Visit: Payer: Self-pay

## 2020-04-02 ENCOUNTER — Ambulatory Visit (HOSPITAL_COMMUNITY): Payer: BLUE CROSS/BLUE SHIELD

## 2020-04-02 ENCOUNTER — Other Ambulatory Visit (HOSPITAL_COMMUNITY): Payer: BLUE CROSS/BLUE SHIELD

## 2020-04-05 ENCOUNTER — Other Ambulatory Visit (HOSPITAL_COMMUNITY): Payer: BLUE CROSS/BLUE SHIELD

## 2020-04-05 ENCOUNTER — Ambulatory Visit (HOSPITAL_COMMUNITY): Payer: BLUE CROSS/BLUE SHIELD

## 2020-04-06 ENCOUNTER — Other Ambulatory Visit (HOSPITAL_COMMUNITY): Payer: BLUE CROSS/BLUE SHIELD

## 2020-04-06 ENCOUNTER — Ambulatory Visit (HOSPITAL_COMMUNITY): Payer: BLUE CROSS/BLUE SHIELD

## 2020-04-07 ENCOUNTER — Ambulatory Visit (HOSPITAL_COMMUNITY): Payer: BLUE CROSS/BLUE SHIELD

## 2020-04-07 ENCOUNTER — Other Ambulatory Visit (HOSPITAL_COMMUNITY): Payer: BLUE CROSS/BLUE SHIELD

## 2020-04-08 ENCOUNTER — Ambulatory Visit (HOSPITAL_COMMUNITY): Payer: BLUE CROSS/BLUE SHIELD

## 2020-04-08 ENCOUNTER — Other Ambulatory Visit (HOSPITAL_COMMUNITY): Payer: BLUE CROSS/BLUE SHIELD

## 2020-04-09 ENCOUNTER — Ambulatory Visit (HOSPITAL_COMMUNITY): Payer: BLUE CROSS/BLUE SHIELD

## 2020-04-09 ENCOUNTER — Other Ambulatory Visit (HOSPITAL_COMMUNITY): Payer: BLUE CROSS/BLUE SHIELD

## 2020-04-13 ENCOUNTER — Ambulatory Visit (HOSPITAL_COMMUNITY): Payer: BLUE CROSS/BLUE SHIELD

## 2020-04-13 ENCOUNTER — Other Ambulatory Visit (HOSPITAL_COMMUNITY): Payer: BLUE CROSS/BLUE SHIELD

## 2020-04-14 ENCOUNTER — Other Ambulatory Visit (HOSPITAL_COMMUNITY): Payer: BLUE CROSS/BLUE SHIELD

## 2020-04-14 ENCOUNTER — Ambulatory Visit (HOSPITAL_COMMUNITY): Payer: BLUE CROSS/BLUE SHIELD

## 2020-04-15 ENCOUNTER — Ambulatory Visit (HOSPITAL_COMMUNITY): Payer: BLUE CROSS/BLUE SHIELD

## 2020-04-15 ENCOUNTER — Other Ambulatory Visit (HOSPITAL_COMMUNITY): Payer: BLUE CROSS/BLUE SHIELD

## 2020-04-16 ENCOUNTER — Other Ambulatory Visit (HOSPITAL_COMMUNITY): Payer: BLUE CROSS/BLUE SHIELD

## 2020-04-16 ENCOUNTER — Ambulatory Visit (HOSPITAL_COMMUNITY): Payer: BLUE CROSS/BLUE SHIELD

## 2020-04-19 ENCOUNTER — Telehealth (HOSPITAL_COMMUNITY): Payer: Self-pay | Admitting: Professional

## 2020-04-21 ENCOUNTER — Telehealth (HOSPITAL_COMMUNITY): Payer: Self-pay | Admitting: Professional

## 2020-04-26 ENCOUNTER — Telehealth (HOSPITAL_COMMUNITY): Payer: Self-pay | Admitting: Professional

## 2020-04-26 ENCOUNTER — Other Ambulatory Visit (HOSPITAL_COMMUNITY): Payer: BLUE CROSS/BLUE SHIELD | Attending: Psychiatry | Admitting: Occupational Therapy

## 2020-04-26 ENCOUNTER — Encounter (HOSPITAL_COMMUNITY): Payer: Self-pay

## 2020-04-26 ENCOUNTER — Other Ambulatory Visit (HOSPITAL_COMMUNITY): Payer: BLUE CROSS/BLUE SHIELD | Admitting: Licensed Clinical Social Worker

## 2020-04-26 ENCOUNTER — Other Ambulatory Visit: Payer: Self-pay

## 2020-04-26 DIAGNOSIS — F314 Bipolar disorder, current episode depressed, severe, without psychotic features: Secondary | ICD-10-CM

## 2020-04-26 DIAGNOSIS — R41844 Frontal lobe and executive function deficit: Secondary | ICD-10-CM | POA: Diagnosis not present

## 2020-04-26 DIAGNOSIS — R4589 Other symptoms and signs involving emotional state: Secondary | ICD-10-CM | POA: Insufficient documentation
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
## 2020-04-26 NOTE — Psych (Signed)
Virtual Visit via Video Note  I connected with Nicole Rangel on 04/26/20 at  9:00 AM EDT by a video enabled telemedicine application and verified that I am speaking with the correct person using two identifiers.   Location: Patient: Patient Home Provider: Clinical Home Office  I discussed the limitations of evaluation and management by telemedicine and the availability of in person appointments. The patient expressed understanding and agreed to proceed.  I discussed the assessment and treatment plan with the patient. The patient was provided an opportunity to ask questions and all were answered. The patient agreed with the plan and demonstrated an understanding of the instructions.   The patient was advised to call back or seek an in-person evaluation if the symptoms worsen or if the condition fails to improve as anticipated.  Pt was provided 240 minutes of non-face-to-face time during this encounter.   Donia Guiles, LCSW    [facility] PHP THERAPIST PROGRESS NOTE  Nicole Rangel [id number]  Session Time: 9:00 - 10:00  Participation Level: Active  Behavioral Response: CasualAlertDepressed  Type of Therapy: Group Therapy  Treatment Goals addressed: Coping  Interventions: CBT, DBT, Supportive and Reframing  Summary: Clinician led check-in regarding current stressors and situation, and review of patient completed daily inventory. Clinician utilized active listening and empathetic response and validated patient emotions. Clinician facilitated processing group on pertinent issues.   Therapist Response:Nicole Rangel is a 39 y.o. female who presents with depression and anxiety symptoms.  Patient arrived within time allowed and reports that she is feeling "not great." [facility] 1-10 with 10 being great. Pt states she did not leave the house this weekend and saw a friend. Pt reports she is struggling with making decisions and being around people. Pt  able to process. Pt engaged in discussion.      Session Time: 10:00-11:00  Participation Level:Active  Behavioral Response:CasualAlertDepressed  Type of Therapy:GroupTherapy  Treatment Goals addressed: Coping  Interventions:CBT, DBT, Solution Focused, Supportive and Reframing  Summary: Cln led discussion on creative solutions. Cln synthesized CBT thought challenging and altering perspectives, self-compassion, and healthy decision making to inform discussion. Group members discussed bariers they are experiencing and worked to determine how they can find creative solutions to these barriers.    Therapist Response:Pt engaged in discussion and shares barriers of struggling with decision making and the practical issues it creates especially when it comes to needing help. Pt is able to process and come up with some creative options to try.       Session Time: 11:00- 12:00  Participation Level:Active  Behavioral Response:CasualAlertDepressed  Type of Therapy: Group Therapy, OT  Treatment Goals addressed: Coping  Interventions:Psychosocial skills training, Supportive  Summary:Occupational Therapy group  Therapist Response:Patient engaged in group. See OT note.      Session Time: 12:00 -1:00  Participation Level:Active  Behavioral Response:CasualAlertDepressed  Type of Therapy:Group therapy  Treatment Goals addressed: Coping  Interventions:CBT; Solution focused; Supportive; Reframing  Summary: 12:00 - 12:50: Cln introduced topic of cognitive distortions. Cln discussed what cognitive distortions are and what effect they have on [place]. Group utilized Psychologist, counselling" handout to begin reviewing common distortions and how they present.  12:50 -1:00 Clinician led check-out. Clinician assessed for immediate needs, medication compliance and efficacy, and safety concerns  Therapist Response:12:00 - 12:50: Pt engaged in  discussion and reports understanding what cognitive distortions are. Pt is able to connect with common distortions and discuss how it plays out in her  life.  12:50 - 1:00: At check-out, patientrates hermood at a 5on a scale of 1-10 with 10 being great.Pt reports afternoon plans of making some calls and listening to an audio book.. Pt demonstrates some progress as evidenced by participating in first group session. Patient denies SI/HI/self-harm at the end of group.     Suicidal/Homicidal: Nowithout intent/plan  Plan: Per pt, she will discontinue PHP due to financial concerns. Pt will return to regular providers for therapy/medication management.    Diagnosis: Bipolar 1 disorder, depressed, severe (HCC) [F31.4]    1. Bipolar 1 disorder, depressed, severe (HCC)       Donia Guiles, LCSW 04/26/2020

## 2020-04-26 NOTE — Psych (Signed)
Virtual Visit via Video Note  I connected with Netta Neat on 04/26/20 at  9:00 AM EDT by a video enabled telemedicine application and verified that I am speaking with the correct person using two identifiers.   I discussed the limitations of evaluation and management by telemedicine and the availability of in person appointments. The patient expressed understanding and agreed to proceed.  I discussed the assessment and treatment plan with the patient. The patient was provided an opportunity to ask questions and all were answered. The patient agreed with the plan and demonstrated an understanding of the instructions.   The patient was advised to call back or seek an in-person evaluation if the symptoms worsen or if the condition fails to improve as anticipated.  I provided 10  minutes of non-face-to-face time during this encounter.   Donia Guiles, LCSW   Location: Patient: Patient Home Provider: Clinical Home Office  ELEANER DIBARTOLO is a 39 y.o. female patient admitted into PHP for depression and anxiety symptoms. Pt and cln completed treatment plan. Pt verbally reports alignment with treatment plan and consents to treatment.    Donia Guiles, LCSW

## 2020-04-26 NOTE — Psych (Signed)
Virtual Visit via Video Note  I connected with Nicole Rangel on 03/23/20 at  2:00 PM EDT by a video enabled telemedicine application and verified that I am speaking with the correct person using two identifiers.   I discussed the limitations of evaluation and management by telemedicine and the availability of in person appointments. The patient expressed understanding and agreed to proceed.  Location: Patient: Home Provider: Clinical Home Office   Follow Up Instructions:    I discussed the assessment and treatment plan with the patient. The patient was provided an opportunity to ask questions and all were answered. The patient agreed with the plan and demonstrated an understanding of the instructions.   The patient was advised to call back or seek an in-person evaluation if the symptoms worsen or if the condition fails to improve as anticipated.  I provided 30 minutes of non-face-to-face time during this encounter.   [name], [facility]      Comprehensive Clinical Assessment (CCA) Note  03/23/2020 Nicole Rangel [id number]  Visit Diagnosis:      ICD-10-CM   1. Bipolar 1 disorder, depressed, severe (HCC)  F31.4       CCA Screening, Triage and Referral (STR)  Patient Reported Information How did you hear about [place]? Primary Care  Referral name: Dr. Clelia Croft- psychologist  Referral phone number: No data recorded  Whom do you see for routine medical problems? No data recorded Practice/Facility Name: No data recorded Practice/Facility Phone Number: No data recorded Name of Contact: No data recorded Contact Number: No data recorded Contact Fax Number: No data recorded Prescriber Name: No data recorded Prescriber Address (if known): No data recorded  What Is the Reason for Your Visit/Call Today? No data recorded How Long Has This Been Causing You Problems? > than 6 months  What Do You Feel Would Help You the Most Today? Group Therapy   Have You Recently Been in  Any Inpatient Treatment (Hospital/Detox/Crisis Center/28-Day Program)? No  Name/Location of Program/Hospital:No data recorded How Long Were You There? No data recorded When Were You Discharged? No data recorded  Have You Ever Received Services From [facility] Before? Yes  Who Do You See at [facility]? No data recorded  Have You Recently Had Any Thoughts About Hurting Yourself? Yes  Are You Planning to Commit Suicide/Harm Yourself At This time? No   Have you Recently Had Thoughts About Hurting Someone Karolee Ohs? No  Explanation: No data recorded  Have You Used Any Alcohol or Drugs in the Past 24 Hours? No  How Long Ago Did You Use Drugs or Alcohol? No data recorded What Did You Use and How Much? No data recorded  Do You Currently Have a Therapist/Psychiatrist? Yes  Name of Therapist/Psychiatrist: Dr. Clelia Croft- therapist   Have You Been Recently Discharged From Any Office Practice or Programs? No  Explanation of Discharge From Practice/Program: No data recorded    CCA Screening Triage Referral Assessment Type of Contact: Tele-Assessment  Is this Initial or Reassessment? Reassessment  Date Telepsych consult ordered in [facility]:  No data recorded Time Telepsych consult ordered in [facility]:  No data recorded  Patient Reported Information Reviewed? Yes  Patient Left Without Being Seen? No data recorded Reason for Not Completing Assessment: No data recorded  Collateral Involvement: notes   Does Patient Have a Court Appointed Legal Guardian? No data recorded Name and Contact of Legal Guardian: No data recorded If Minor and Not Living with Parent(s), Who has Custody? No data recorded Is CPS  involved or ever been involved? Never  Is APS involved or ever been involved? Never   Patient Determined To Be At Risk for Harm To Self or Others Based on Review of Patient Reported Information or Presenting Complaint? No  Method: No Plan  Availability of Means: No data recorded Intent: No  data recorded Notification Required: No data recorded Additional Information for Danger to Others Potential: No data recorded Additional Comments for Danger to Others Potential: No data recorded Are There Guns or Other Weapons in Your Home? No  Types of Guns/Weapons: No data recorded Are These Weapons Safely Secured?                            No data recorded Who Could Verify You Are Able To Have These Secured: No data recorded Do You Have any Outstanding Charges, Pending Court Dates, Parole/Probation? No data recorded Contacted To Inform of Risk of Harm To Self or Others: No data recorded  Location of Assessment: No data recorded  Does Patient Present under Involuntary Commitment? No  IVC Papers Initial File Date: No data recorded  [place] of Residence: [place]   Patient Currently Receiving the Following Services: Medication Management;Individual Therapy   Determination of Need: No data recorded  Options For Referral: Partial Hospitalization     CCA Biopsychosocial  Intake/Chief Complaint:  CCA Intake With Chief Complaint CCA Part Two Date: 03/23/20 CCA Part Two Time: 1400 Chief Complaint/Presenting Problem: Pt reports increased depression and struggles in every day life. Pt referred by cln [name] Patient's Currently Reported Symptoms/Problems: Voices in head; increased depression; decreased ADLs; increased anxiety; decreased appetite; work problems, change in sexual interest, panic attacks Individual's Strengths: understands treatment options Individual's Preferences: to feel better Individual's Abilities: can attend and participate in treatment Type of Services Patient Feels Are Needed: PHP Initial Clinical Notes/Concerns: Pt declined PHP recently due to ECT. Pt reports ECT did not work and wants to try PHP now  Mental Health Symptoms Depression:  Depression: Change in energy/activity, Difficulty Concentrating, Fatigue, Hopelessness, Worthlessness,  Tearfulness, Sleep (too much or little), Irritability, Increase/decrease in appetite  Mania:     Anxiety:      Psychosis:  Psychosis: Hallucinations  Trauma:     Obsessions:     Compulsions:     Inattention:     Hyperactivity/Impulsivity:     Oppositional/Defiant Behaviors:     Emotional Irregularity:  Emotional Irregularity: Mood lability, Unstable self-image, Potentially harmful impulsivity  Other Mood/Personality Symptoms:      Mental Status Exam Appearance and self-care  Stature:  Stature: Average  Weight:  Weight: Overweight  Clothing:  Clothing: Casual  Grooming:  Grooming: Normal  Cosmetic use:  Cosmetic Use: None  Posture/gait:  Posture/Gait: Normal  Motor activity:  Motor Activity: Not Remarkable  Sensorium  Attention:  Attention: Distractible  Concentration:  Concentration: Focuses on irrelevancies, Anxiety interferes  Orientation:     Recall/memory:  Recall/Memory: Normal  Affect and Mood  Affect:  Affect: Depressed  Mood:  Mood: Depressed  Relating  Eye contact:  Eye Contact: Fleeting  Facial expression:  Facial Expression: Depressed  Attitude toward examiner:  Attitude Toward Examiner: Cooperative  Thought and Language  Speech flow: Speech Flow: Normal  Thought content:  Thought Content: Appropriate to Mood and Circumstances  Preoccupation:  Preoccupations: Ruminations  Hallucinations:  Hallucinations: Auditory  Organization:     Company secretary of Knowledge:  Fund of Knowledge: Average  Intelligence:  Intelligence:  Average  Abstraction:  Abstraction: Normal  Judgement:  Judgement: Poor  Reality Testing:  Reality Testing: Adequate  Insight:  Insight: Poor  Decision Making:  Decision Making: Vacilates  Social Functioning  Social Maturity:  Social Maturity: Isolates  Social Judgement:  Social Judgement: Normal  Stress  Stressors:  Stressors: Family conflict, Grief/losses, Illness, Surveyor, quantity, Relationship  Coping Ability:  Coping Ability:  Deficient supports  Skill Deficits:  Skill Deficits: Activities of daily living, Decision making, Responsibility  Supports:  Supports: Support needed     Religion: Religion/Spirituality Are You A Religious Person?: Yes How Might This Affect Treatment?: "spiritual" it Research officer, trade union: Leisure / Recreation Do You Have Hobbies?: Yes Leisure and Hobbies: reading, uses Contractor, loves to go to [employer]  Exercise/Diet: Exercise/Diet Do You Exercise?: No Have You Gained or Lost A Significant Amount of Weight in the Past Six Months?: No Do You Follow a Special Diet?: No Do You Have Any Trouble Sleeping?: Yes Explanation of Sleeping Difficulties: struggles to get restful sleep   CCA Employment/Education  Employment/Work Situation: Employment / Work Situation Employment situation: On disability Why is patient on disability: mental health Patient's job has been impacted by current illness: No Has patient ever been in the [employer]?: No Did You Receive Any Psychiatric Treatment/Services While in Equities trader?: No  Education: Education Is Patient Currently Attending School?: No   CCA Family/Childhood History  Family and Relationship History: Family history Marital status: Single Are you sexually active?: No What is your sexual orientation?: heterosexual Has your sexual activity been affected by drugs, alcohol, medication, or emotional stress?: decreased- pt reports not using vibrator as often as in past Does patient have children?: Yes How many children?: 1 How is patient's relationship with their children?: 22 yo daughter; strained relationship  Childhood History:  Childhood History By whom was/is the patient raised?: Mother Description of patient's relationship with caregiver when they were a child: felt mother was neglectful, unsupportive; no contact w father, he "doesnt count" How were you disciplined when you got in trouble as a child/adolescent?: "she just wasnt  there for me" Does patient have siblings?: Yes Number of Siblings: 3 Description of patient's current relationship with siblings: 2 deceased; 1 younger brother- ok relationship Did patient suffer any verbal/emotional/physical/sexual abuse as a child?: No Did patient suffer from severe childhood neglect?: No Has patient ever been sexually abused/assaulted/raped as an adolescent or adult?: No Was the patient ever a victim of a crime or a disaster?: No Witnessed domestic violence?: No Has patient been affected by domestic violence as an adult?: No  Child/Adolescent Assessment:     CCA Substance Use  Alcohol/Drug Use: Alcohol / Drug Use Pain Medications: see MAR Prescriptions: see MAR Over the Counter: see MAR History of alcohol / drug use?: No history of alcohol / drug abuse                         ASAM's:  Six Dimensions of Multidimensional Assessment  Dimension 1:  Acute Intoxication and/or Withdrawal Potential:      Dimension 2:  Biomedical Conditions and Complications:      Dimension 3:  Emotional, Behavioral, or Cognitive Conditions and Complications:     Dimension 4:  Readiness to Change:     Dimension 5:  Relapse, Continued use, or Continued Problem Potential:     Dimension 6:  Recovery/Living Environment:     ASAM Severity Score:    ASAM Recommended Level of Treatment:  Substance use Disorder (SUD)    Recommendations for Services/Supports/Treatments: Recommendations for Services/Supports/Treatments Recommendations For Services/Supports/Treatments: Partial Hospitalization  DSM5 Diagnoses: Patient Active Problem List   Diagnosis Date Noted  . Bipolar 1 disorder, depressed, severe (HCC) 08/02/2016  . Bipolar affective disorder, currently depressed, moderate (HCC) 08/02/2016  . Morbid (severe) obesity due to excess calories (HCC) 03/19/2016  . Borderline diabetes mellitus 03/19/2016  . Acquired hypothyroidism 12/28/2015  . Headache(784.0) 03/23/2014   . Cephalalgia 03/23/2014  . Adult ADHD 03/11/2013  . Generalized anxiety disorder 03/11/2013  . Dyslipidemia 03/11/2013  . Bipolar disorder, unspecified (HCC) 03/11/2013  . Urinary incontinence 03/11/2013  . Adult attention deficit disorder 03/11/2013  . Bipolar affective disorder (HCC) 03/11/2013    Patient Centered Plan: Patient is on the following Treatment Plan(s):  Depression   Referrals to Alternative Service(s): Referred to Alternative Service(s):   Place:   Date:   Time:    Referred to Alternative Service(s):   Place:   Date:   Time:    Referred to Alternative Service(s):   Place:   Date:   Time:    Referred to Alternative Service(s):   Place:   Date:   Time:     Quinn AxeWhitney J Haruto Demaria

## 2020-04-27 ENCOUNTER — Other Ambulatory Visit: Payer: Self-pay

## 2020-04-27 ENCOUNTER — Other Ambulatory Visit (HOSPITAL_COMMUNITY): Payer: BLUE CROSS/BLUE SHIELD

## 2020-04-27 ENCOUNTER — Telehealth (HOSPITAL_COMMUNITY): Payer: Self-pay | Admitting: Professional

## 2020-04-28 ENCOUNTER — Other Ambulatory Visit (HOSPITAL_COMMUNITY): Payer: BLUE CROSS/BLUE SHIELD

## 2020-04-28 ENCOUNTER — Other Ambulatory Visit: Payer: Self-pay

## 2020-04-29 ENCOUNTER — Other Ambulatory Visit (HOSPITAL_COMMUNITY): Payer: BLUE CROSS/BLUE SHIELD

## 2020-04-29 ENCOUNTER — Ambulatory Visit (HOSPITAL_COMMUNITY): Payer: BLUE CROSS/BLUE SHIELD

## 2020-04-30 ENCOUNTER — Other Ambulatory Visit (HOSPITAL_COMMUNITY): Payer: BLUE CROSS/BLUE SHIELD

## 2020-04-30 ENCOUNTER — Ambulatory Visit (HOSPITAL_COMMUNITY): Payer: BLUE CROSS/BLUE SHIELD

## 2020-05-03 ENCOUNTER — Ambulatory Visit (HOSPITAL_COMMUNITY): Payer: BLUE CROSS/BLUE SHIELD

## 2020-05-03 ENCOUNTER — Other Ambulatory Visit (HOSPITAL_COMMUNITY): Payer: BLUE CROSS/BLUE SHIELD

## 2020-05-04 ENCOUNTER — Other Ambulatory Visit (HOSPITAL_COMMUNITY): Payer: BLUE CROSS/BLUE SHIELD

## 2020-05-04 ENCOUNTER — Ambulatory Visit (HOSPITAL_COMMUNITY): Payer: BLUE CROSS/BLUE SHIELD

## 2020-05-05 ENCOUNTER — Ambulatory Visit (HOSPITAL_COMMUNITY): Payer: BLUE CROSS/BLUE SHIELD

## 2020-05-05 ENCOUNTER — Other Ambulatory Visit (HOSPITAL_COMMUNITY): Payer: BLUE CROSS/BLUE SHIELD

## 2020-05-06 ENCOUNTER — Other Ambulatory Visit (HOSPITAL_COMMUNITY): Payer: BLUE CROSS/BLUE SHIELD

## 2020-05-06 ENCOUNTER — Ambulatory Visit (HOSPITAL_COMMUNITY): Payer: BLUE CROSS/BLUE SHIELD

## 2020-05-07 ENCOUNTER — Other Ambulatory Visit (HOSPITAL_COMMUNITY): Payer: BLUE CROSS/BLUE SHIELD

## 2020-05-07 ENCOUNTER — Ambulatory Visit (HOSPITAL_COMMUNITY): Payer: BLUE CROSS/BLUE SHIELD

## 2022-05-19 ENCOUNTER — Emergency Department (HOSPITAL_BASED_OUTPATIENT_CLINIC_OR_DEPARTMENT_OTHER): Payer: BLUE CROSS/BLUE SHIELD | Admitting: Radiology

## 2022-05-19 ENCOUNTER — Encounter (HOSPITAL_BASED_OUTPATIENT_CLINIC_OR_DEPARTMENT_OTHER): Payer: Self-pay | Admitting: Emergency Medicine

## 2022-05-19 ENCOUNTER — Other Ambulatory Visit: Payer: Self-pay

## 2022-05-19 ENCOUNTER — Emergency Department (HOSPITAL_BASED_OUTPATIENT_CLINIC_OR_DEPARTMENT_OTHER)
Admission: EM | Admit: 2022-05-19 | Discharge: 2022-05-20 | Disposition: A | Discharge status: Home / Self Care | Payer: BLUE CROSS/BLUE SHIELD | Attending: Emergency Medicine | Admitting: Emergency Medicine

## 2022-05-19 DIAGNOSIS — M25512 Pain in left shoulder: Secondary | ICD-10-CM | POA: Diagnosis present

## 2022-05-19 DIAGNOSIS — M25511 Pain in right shoulder: Secondary | ICD-10-CM | POA: Diagnosis not present

## 2022-05-19 DIAGNOSIS — M545 Low back pain, unspecified: Secondary | ICD-10-CM | POA: Insufficient documentation

## 2022-05-19 DIAGNOSIS — Y9389 Activity, other specified: Secondary | ICD-10-CM | POA: Diagnosis not present

## 2022-05-19 DIAGNOSIS — G8911 Acute pain due to trauma: Secondary | ICD-10-CM

## 2022-05-19 DIAGNOSIS — Y92481 Parking lot as the place of occurrence of the external cause: Secondary | ICD-10-CM | POA: Diagnosis not present

## 2022-05-19 DIAGNOSIS — E039 Hypothyroidism, unspecified: Secondary | ICD-10-CM | POA: Diagnosis not present

## 2022-05-19 LAB — PREGNANCY, URINE: Preg Test, Ur: NEGATIVE
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
## 2022-05-20 ENCOUNTER — Encounter (HOSPITAL_BASED_OUTPATIENT_CLINIC_OR_DEPARTMENT_OTHER): Payer: Self-pay | Admitting: Emergency Medicine

## 2022-05-20 MED ORDER — METHOCARBAMOL 500 MG PO TABS
500.0000 mg | ORAL_TABLET | Freq: Every evening | ORAL | 0 refills | Status: AC
Start: 2022-05-20 — End: 2022-05-27

## 2022-05-20 MED ORDER — NAPROXEN 250 MG PO TABS
500.0000 mg | ORAL_TABLET | Freq: Once | ORAL | Status: AC
Start: 2022-05-20 — End: 2022-05-20
  Administered 2022-05-20: 500 mg via ORAL
  Filled 2022-05-20: qty 2

## 2022-05-20 MED ORDER — NAPROXEN 500 MG PO TABS: 500.0000 mg | ORAL_TABLET | Freq: Two times a day (BID) | ORAL | 0 refills | Status: AC

## 2022-05-20 MED ORDER — METHOCARBAMOL 500 MG PO TABS
500.0000 mg | ORAL_TABLET | Freq: Once | ORAL | Status: AC
  Administered 2022-05-20: 500 mg via ORAL
  Filled 2022-05-20: qty 1

## 2022-05-20 NOTE — ED Notes (Signed)
Reviewed AVS/discharge instruction with patient. Time allotted for and all questions answered. Patient is agreeable for d/c and escorted to ed exit by staff.  

## 2022-06-08 ENCOUNTER — Ambulatory Visit
Admission: RE | Admit: 2022-06-08 | Discharge: 2022-06-08 | Disposition: A | Discharge status: Home / Self Care | Payer: BLUE CROSS/BLUE SHIELD | Source: Ambulatory Visit | Attending: Chiropractic Medicine | Admitting: Chiropractic Medicine

## 2022-06-08 ENCOUNTER — Other Ambulatory Visit: Payer: Self-pay | Admitting: Chiropractic Medicine

## 2022-06-08 DIAGNOSIS — M549 Dorsalgia, unspecified: Secondary | ICD-10-CM

## 2022-06-08 DIAGNOSIS — M542 Cervicalgia: Secondary | ICD-10-CM
